# Patient Record
Sex: Female | Born: 1955 | Race: White | Hispanic: No | Marital: Married | Smoking: Never smoker
Health system: Southern US, Community
[De-identification: ages and names within clinical notes are randomized; demographics above are authoritative.]

## PROBLEM LIST (undated history)

## (undated) HISTORY — PX: KNEE SURGERY: SHX244

---

## 1998-06-30 ENCOUNTER — Ambulatory Visit (HOSPITAL_COMMUNITY): Admission: RE | Admit: 1998-06-30 | Discharge: 1998-06-30 | Payer: Self-pay

## 1999-09-12 ENCOUNTER — Ambulatory Visit (HOSPITAL_COMMUNITY): Admission: RE | Admit: 1999-09-12 | Discharge: 1999-09-12 | Payer: Self-pay | Admitting: Obstetrics & Gynecology

## 1999-09-12 ENCOUNTER — Encounter: Payer: Self-pay | Admitting: Obstetrics & Gynecology

## 1999-09-13 ENCOUNTER — Other Ambulatory Visit: Admission: RE | Admit: 1999-09-13 | Discharge: 1999-09-13 | Payer: Self-pay | Admitting: Obstetrics & Gynecology

## 2000-07-24 ENCOUNTER — Encounter: Payer: Self-pay | Admitting: Emergency Medicine

## 2000-07-24 ENCOUNTER — Emergency Department (HOSPITAL_COMMUNITY): Admission: EM | Admit: 2000-07-24 | Discharge: 2000-07-24 | Payer: Self-pay | Admitting: Emergency Medicine

## 2000-08-25 ENCOUNTER — Inpatient Hospital Stay (HOSPITAL_COMMUNITY): Admission: EM | Admit: 2000-08-25 | Discharge: 2000-08-28 | Payer: Self-pay | Admitting: Cardiology

## 2000-08-30 ENCOUNTER — Encounter: Payer: Self-pay | Admitting: Cardiology

## 2000-08-30 ENCOUNTER — Ambulatory Visit (HOSPITAL_COMMUNITY): Admission: RE | Admit: 2000-08-30 | Discharge: 2000-08-30 | Payer: Self-pay | Admitting: Cardiology

## 2000-10-16 ENCOUNTER — Other Ambulatory Visit: Admission: RE | Admit: 2000-10-16 | Discharge: 2000-10-16 | Payer: Self-pay | Admitting: Obstetrics & Gynecology

## 2000-10-18 ENCOUNTER — Encounter: Payer: Self-pay | Admitting: Internal Medicine

## 2000-10-18 ENCOUNTER — Ambulatory Visit (HOSPITAL_COMMUNITY): Admission: RE | Admit: 2000-10-18 | Discharge: 2000-10-18 | Payer: Self-pay | Admitting: Internal Medicine

## 2001-11-11 ENCOUNTER — Other Ambulatory Visit: Admission: RE | Admit: 2001-11-11 | Discharge: 2001-11-11 | Payer: Self-pay | Admitting: Obstetrics & Gynecology

## 2001-11-25 ENCOUNTER — Ambulatory Visit (HOSPITAL_COMMUNITY): Admission: RE | Admit: 2001-11-25 | Discharge: 2001-11-25 | Payer: Self-pay | Admitting: Obstetrics & Gynecology

## 2001-11-25 ENCOUNTER — Encounter: Payer: Self-pay | Admitting: Obstetrics & Gynecology

## 2002-12-17 ENCOUNTER — Ambulatory Visit (HOSPITAL_COMMUNITY): Admission: RE | Admit: 2002-12-17 | Discharge: 2002-12-17 | Payer: Self-pay | Admitting: Obstetrics & Gynecology

## 2002-12-17 ENCOUNTER — Encounter: Payer: Self-pay | Admitting: Obstetrics & Gynecology

## 2003-01-04 ENCOUNTER — Other Ambulatory Visit: Admission: RE | Admit: 2003-01-04 | Discharge: 2003-01-04 | Payer: Self-pay | Admitting: Obstetrics & Gynecology

## 2003-01-08 ENCOUNTER — Ambulatory Visit (HOSPITAL_COMMUNITY): Admission: RE | Admit: 2003-01-08 | Discharge: 2003-01-08 | Payer: Self-pay | Admitting: Obstetrics & Gynecology

## 2003-01-08 ENCOUNTER — Encounter: Payer: Self-pay | Admitting: Obstetrics & Gynecology

## 2004-02-04 ENCOUNTER — Ambulatory Visit (HOSPITAL_COMMUNITY): Admission: RE | Admit: 2004-02-04 | Discharge: 2004-02-04 | Payer: Self-pay | Admitting: Obstetrics & Gynecology

## 2005-02-28 ENCOUNTER — Ambulatory Visit (HOSPITAL_COMMUNITY): Admission: RE | Admit: 2005-02-28 | Discharge: 2005-02-28 | Payer: Self-pay | Admitting: Obstetrics & Gynecology

## 2005-03-01 ENCOUNTER — Encounter: Admission: RE | Admit: 2005-03-01 | Discharge: 2005-03-01 | Payer: Self-pay | Admitting: Orthopedic Surgery

## 2005-10-04 ENCOUNTER — Encounter: Admission: RE | Admit: 2005-10-04 | Discharge: 2005-10-04 | Payer: Self-pay | Admitting: Internal Medicine

## 2006-03-28 ENCOUNTER — Ambulatory Visit (HOSPITAL_COMMUNITY): Admission: RE | Admit: 2006-03-28 | Discharge: 2006-03-28 | Payer: Self-pay | Admitting: Obstetrics & Gynecology

## 2006-04-15 ENCOUNTER — Encounter: Admission: RE | Admit: 2006-04-15 | Discharge: 2006-04-15 | Payer: Self-pay | Admitting: Obstetrics & Gynecology

## 2007-07-15 ENCOUNTER — Ambulatory Visit (HOSPITAL_COMMUNITY): Admission: RE | Admit: 2007-07-15 | Discharge: 2007-07-15 | Payer: Self-pay | Admitting: Obstetrics & Gynecology

## 2008-07-01 ENCOUNTER — Encounter: Admission: RE | Admit: 2008-07-01 | Discharge: 2008-09-07 | Payer: Self-pay | Admitting: Neurology

## 2008-09-20 ENCOUNTER — Ambulatory Visit (HOSPITAL_COMMUNITY): Admission: RE | Admit: 2008-09-20 | Discharge: 2008-09-20 | Payer: Self-pay | Admitting: Obstetrics & Gynecology

## 2009-10-12 ENCOUNTER — Encounter: Admission: RE | Admit: 2009-10-12 | Discharge: 2009-10-12 | Payer: Self-pay | Admitting: Obstetrics & Gynecology

## 2010-10-28 ENCOUNTER — Encounter: Payer: Self-pay | Admitting: Internal Medicine

## 2010-10-29 ENCOUNTER — Encounter: Payer: Self-pay | Admitting: Obstetrics & Gynecology

## 2010-10-29 ENCOUNTER — Encounter: Payer: Self-pay | Admitting: Internal Medicine

## 2010-10-31 ENCOUNTER — Other Ambulatory Visit (HOSPITAL_COMMUNITY): Payer: Self-pay | Admitting: Obstetrics & Gynecology

## 2010-10-31 DIAGNOSIS — Z1239 Encounter for other screening for malignant neoplasm of breast: Secondary | ICD-10-CM

## 2010-11-13 ENCOUNTER — Ambulatory Visit (HOSPITAL_COMMUNITY)
Admission: RE | Admit: 2010-11-13 | Discharge: 2010-11-13 | Disposition: A | Discharge status: Home / Self Care | Payer: BC Managed Care – PPO | Source: Ambulatory Visit | Attending: Obstetrics & Gynecology | Admitting: Obstetrics & Gynecology

## 2010-11-13 DIAGNOSIS — Z1239 Encounter for other screening for malignant neoplasm of breast: Secondary | ICD-10-CM

## 2010-11-13 DIAGNOSIS — Z1231 Encounter for screening mammogram for malignant neoplasm of breast: Secondary | ICD-10-CM | POA: Insufficient documentation

## 2011-02-23 NOTE — Consult Note (Signed)
Nicole Hall. [facility]  Patient:    Nicole Hall, Nicole Hall                       MRN: [id number] Proc. Date: 08/27/00 Adm. Date:  [id number] Attending:  Talitha Hall Dictator:   Nicole Hall, N.P. CC:         Nicole Hall, M.D. [facility]  Nicole Hall, M.D.   Consultation Report  DATE OF BIRTH:  [birth year]  REASON FOR CONSULTATION:  Pericardial effusion, question occult malignancy.  REFERRING PHYSICIAN:  Noralyn Pick. Eden Hall, M.D. [facility]  HISTORY OF PRESENT ILLNESS:  [name] is a 55 year old female with no significant past medical history who presented to the emergency department at [facility] on July 24, 2000, with complaints of chest pain stating that she felt as if a brick was on her chest.  No diagnoses was established at that time.  Lab values during that visit to the emergency room found her hemoglobin to be 13.1, platelet count 272,000, WBCs 10.3, and neutrophils at 75% and albumin of 4.6.  Her chest discomfort persisted for the next few weeks.  On August 25, 2000, she presented to [facility] emergency department with increasing shortness of breath, dyspnea on exertion, fatigue and malaise.  She was found to have a pericardial effusion and was transferred to [facility] for further evaluation.  The etiology of this effusion is unknown but is suspected to be viral.  Her hemoglobin upon presentation to [facility] was 9.7, platelet count 518,000, WBCs 10, neutrophils 80% and albumin of 2.5.  We have been asked to see her for a possible occult cancer.  PAST MEDICAL HISTORY:  None.  CURRENT MEDICATIONS: 1. Aspirin 325 mg p.o. q. day. 2. Ferrous gluconate 300 mg p.o. t.i.d. 3. Ibuprofen 800 mg p.o. t.i.d.  The patient is on no medications at home.  ALLERGIES:  No known drug allergies.  FAMILY HISTORY:  The patient is adopted and is unaware of her biological parents medical history.  She does state, however, that she  believes a grandmother is deceased secondary to cancer.  SOCIAL HISTORY:  Nicole Hall lives in [place] with her husband and 3 children.  She has 3 sons ages 40, 80, and 40 who are all healthy.  She does not work outside of the home.  She reports occasional ETOH use and a very remote, "social" use of tobacco.  HEALTH MAINTENANCE: 1. Pap smear/pelvic.  Up to date. 2. Mammogram - December of 2000 (she has her next one scheduled for January of 2002. 3. Colonoscopy - None.  GYN HISTORY:  Gravida 4, para 3 with 1 spontaneous abortion.  Patient reports all of her deliveries were normal vaginal deliveries.  She denies any hormone replacement therapy but does report taking progesterone recently.  She describes herself as "perimenopausal".  She reports taking birth control pills approximately 25 years ago for a few months but was instructed to stop these secondary to a questionable superficial clot.  She reports menarche at age 2.  REVIEW OF SYSTEMS:  The patient denies any weight loss.  She has had some recent night sweats (three nights within the past week).  She denies any fevers.  She reports her appetite has been erratic for about the past week. Her energy level has been diminished.  She does report shortness of breath as well as some intermittent chest pressure.  She also reports the onset of a dry cough approximately  1-1/2 weeks ago.  She also has some left shoulder pain. She denies any hemoptysis.  She has had no edema.  She denies any recent change in her bowel habits.  She denies any breast pain, lumps, or nipple discharge.  She has had no recent change in her bowel habits.  She denies any rectal bleeding, melena, nausea, vomiting, or abdominal pain.  She denies any numbness or tingling of her extremities.  PHYSICAL EXAMINATION:  GENERAL:  Well-nourished pleasant Caucasian female in no acute distress.  HEENT:  Normocephalic and atraumatic; pupils, equal, round, reactive to  light; extraocular movements are intact; sclerae anicteric.  Oropharynx is clear.  LYMPH:  No palpable lymph nodes.  BREASTS:  No obvious masses or skin changes.  LUNGS:  With bibasilar bronchial breath sounds left greater than right.  CARDIOVASCULAR:  Pericardial rub.  ABDOMEN:  Soft, benign.  EXTREMITIES:  No edema.  RECTAL:  No masses; occult blood negative.  LABORATORY DATA:  August 26, 2000:  Hemoglobin 9.7, white count 10.0, platelets 518,000.  Iron 13, TIBC 195, percent saturations 7.  Ferritin 904. TSH 3.4.  Rheumatoid factor less than 20.  Sodium 140, potassium 4.1, BUN 10, creatinine 0.6, glucose 118.  Total bilirubin 0.6, alk. phos. 76, SGOT 14, SGPT 14, total protein 6.5, albumin 2.5, and calcium of 8.5.  CEA less than 0.5.  HIV nonreactive.  Blood cultures with no growth for 2 days.  CT scan of the abdomen and pelvis is pending.  IMPRESSION AND PLAN: 1. Acute pericarditis with probable bilateral pleural effusions - etiology    unknown. The etiological differential diagnosis of this is extensive and    includes a viral, autoimmune, bacterial (MTB), other (granulomatous, etc),    malignancy.     If the fluid does not resolve with conservative management suggest sampling    the fluid and possibly the pericardium itself.     We will review the CT scans of the abdomen and pelvis that the patient is    scheduled to have today, as well as the chest CT that the patient had done    at [facility].  2. Normochromic, normocytic anemia - probably anemia of chronic disease.  This    is an expected finding with any of the possible etiologies. 3. Thrombocytosis, reactive. 4. Hypoalbuminemia.  Nicole Hall discussed the above with Nicole Hall and her husband.  He will see the patient after the CT scan is done, and make further comment at that time.  The patient was seen and examined by Nicole Hall.  DD:  08/27/00 TD:  08/28/00 Job: [id number]

## 2011-02-23 NOTE — H&P (Signed)
[place]. [facility]  Patient:    Nicole Hall, Nicole Hall                       MRN: [id number] Adm. Date:  [id number] Attending:  Talitha Givens CC:         Dr. Nedra Hai, [facility]  Dr. Myrtis Ser  Dr. Andee Lineman   History and Physical  CHIEF COMPLAINT: Referral for evaluation of pericardial effusion.  HISTORY OF PRESENT ILLNESS: Nicole Hall is a 55 year old white female with no significant past medical history, who has been referred by Dr. Nedra Hai for evaluation of pericardial effusion.  The patient was earlier today seen at [facility] due to complaints of increased shortness of breath over the last four weeks.  The patients shortness of breath had progressed to the point where it was very difficult for her to do any type of daily activities. In addition, she reports pleuritic type chest pain which has gradually worsened over the last few weeks.  There is no radiation of the pain into the neck, arm, or jaw.  She reports orthopnea but no PND.  She denies syncope or palpitations.  She was seen earlier today in the [facility] Room and worked up for possible pulmonary embolism.  A spiral CT scan was done and this was negative for pulmonary embolism but she was found to have a large pericardial effusion as well as bilateral pleural effusion and infiltrate/ atelectasis of the left lower lobe.  The patient reports a viral illness over the last two weeks.  She has taken frequent Tylenol and Advil.  She was also prescribed a Z-pak for five days due to nasal congestion and cough.  However, she denies viral illness prior to the onset of symptoms of shortness of breath.  She reports no syncope or presyncope.  PAST MEDICAL HISTORY: The patient gets yearly pelvic and breast examinations, which have been within normal limits.  There is no history of tuberculosis and she had a negative skin test two years ago.  There is heart murmur.  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  She does not smoke or drink alcohol.  FAMILY HISTORY: Noncontributory for coronary artery disease or other concern for heart disease.  ALLERGIES: No known drug allergies.  MEDICATIONS: NSAIDs including Advil and ibuprofen p.r.n.  REVIEW OF SYSTEMS: The patient reports last week she had fevers although she has not measured her temperature.  She also reports chills for two weeks.  She has had no nausea, vomiting, or diarrhea.  No hematochezia, hemoptysis, or hematemesis.  She denies weight loss and she has maintained her appetite.  She denies skin rashes including a malar rash.  She denies any foreign of out-of-state travel.  PHYSICAL EXAMINATION:  VITAL SIGNS: Blood pressure is 140/70.  Pulsus paradoxus was measured at 10 mmHg.  Heart rate 103-110 beats per minute.  The patient was afebrile on admission.  GENERAL: The patient is a well-nourished white female, very anxious and tearful but in no apparent distress.  HEENT: Pupils isocoric.  Conjunctivae clear.  NECK: JVD 12 cm with positive jugular reflux, no obvious steep X or Y descent on inspection of the jugular venous pulse.  No Kussmaul sign.  Normal carotid upstrokes with no carotid bruits.  No thyromegaly.  No nodular thyroid.  NODES: No cervical, occipital, supraclavicular, or axillary nodes were felt on palpation.  BREAST:  No obvious masses or nodules on bilateral breast examination.  BACK: No CVA tenderness.  No rash.  LUNGS: Decreased breath sounds bilaterally, approximately one-third of the way up; positive egophony; no crackles.  HEART: Tachycardic.  Normal S1 and S2.  Normal physiologic splitting of the second heart sound.  A three component pericardial left friction rub is audible at the left lower and left upper sternal border.  There is no S3. There are no obvious murmurs.  PMI is non-displaced but is  difficult to palpate.  ABDOMEN: Soft, nontender.  Good bowel sounds.  No hepatomegaly.  No pulsatile liver.  No splenomegaly.  GU: Examination deferred.   Please refer to above.  The patient gets a yearly pelvic examination.  EXTREMITIES: There are 2+ peripheral pulses noted.  There is no clubbing, cyanosis, or edema.  SKIN: No rashes.  NEUROLOGIC: The patient is alert and oriented, grossly nonfocal.  LABORATORY DATA: A 12 lead electrocardiogram showed normal sinus rhythm with low voltage QRS in extremity leads only; no infarct patterns; no electrical alternans seen on EKG.  Chest x-ray shows cardiomegaly with bilateral pleural effusions and streaky atelectasis in both lower lobes.  CT scan (this was reviewed with the radiologist, this study done at [facility]) showed large pericardial effusion which is circumferential; bilateral pleural effusions which are moderate in size and are layering; compression atelectasis in the left lower lobe; no obvious infiltrates otherwise; no evidence of dissection; no evidence of pulmonary embolism on this study; no perihilar or paratracheal nodes seen.  A 2D echocardiogram shows a large pericardial effusion predominantly posterior, where it is approximately 2 cm during diastole; approximately 0.5 cm anteriorly; no obvious RV collapse during early diastole; RA inversion noted; also by M mode there is no evidence of RV collapse; mitral inflow and tricuspid inflow were not suggestive of tamponade physiology - in particular there were no - changes; normal LV size and contraction; normal left and right atrium.  Of note is that pericardial effusion would only be accessible with a pericardiocentesis needle from the anterior axillary line.  An approach from the apex or parasternal or  subxiphoid sites would be very difficult as there is less than a 0.5 cm effusion in these locations.  IMPRESSION/PLAN: Pericardial effusion, E causa ignota.  Most  likely causes include viral pericarditis or idiopathic pericarditis, particularly because there is evidence of inflammation on physical examination with a three component friction rub.  However, tumor/malignancy cannot be excluded.  There is no obvious evidence on physical examination of a primary malignancy. Differential diagnosis also includes collagen vascular diseases and particularly systemic lupus erythematosus, although there are no other clinical stigmata.  The patient also will be ruled out for hypothyroidism and a TSH level is pending.  Other infectious causes are unlikely, in particular purulent pericarditis or tuberculous pericarditis seems unlikely at this point in time.  There is no obvious tamponade physiology by 2D echocardiogram criteria.  However, there is a 10 mmHg pulsus paradoxus by physical examination which could be suggestive of pre-tamponade physiology.  There is no absolute indication presently to proceed with diagnostic/therapeutic pericardiocentesis, particularly because of the unfavorable location of the pericardial effusion.  If there is no obvious diagnosis established by serologic testing and/or pulsus paradoxus worsens and/or there is evidence of tamponade physiology by cardiac catheterization then the patient should proceed with subxiphoid placement of a pericardial drain or a video-assisted thoracoscopic procedure.   The patient in the interim will be started on nonsteroidal therapy with Indomethacin 50  mg p.o. t.i.d.  The risks and benefits of a cardiac catheterization were carefully discussed with the patient.  Due to equivocal findings on physical examination of possible tamponade physiology I feel a right and left heart catheterization without coronary angiography is indicated at the present time.  If tamponade physiology is established then pericardial drainage as well as obtaining a pericardial tissue specimen is indicated.  The risks and benefits  of pericardiocentesis versus surgical approach to the pericardial effusion has been discussed extensively with the patient and her husband.  In the interim we will obtain thyroid function tests, human immunodeficiency virus testing, complement levels, ANA, rheumatoid factor, and sedimentation rate.  Blood cultures x 2 will be obtained and PPD skin test with Candida control will be applied. DD:  08/26/00 TD:  08/26/00 Job: [id number]

## 2011-02-23 NOTE — Cardiovascular Report (Signed)
[place]. [facility]  Patient:    Nicole Hall, Nicole Hall                       MRN: [id number] Proc. Date: 08/26/00 Adm. Date:  [id number] Attending:  Talitha Givens Dictator:   Noralyn Pick Eden Emms, M.D. [facility]                        Cardiac Catheterization  PROCEDURE:  Coronary Angiography.  CARDIOLOGIST:  Noralyn Pick. Eden Emms, M.D. [facility]  INDICATION:  Large pericardial effusion. Rule out tamponade.  RESULTS:  Left main coronary artery was normal.  Left anterior descending artery was normal.  Circumflex coronary artery was normal.  Right coronary artery was dominant and was normal.  RAO VENTRICULOGRAPHY:  RAO ventriculography revealed hyperdynamic LV function.  The ejection fraction was in excess of 80%.  There was catheter-induced and post PVC MR.  RIGHT HEART CATHETERIZATION:  Right heart catheterization showed a mean right atrial pressure of 15 mmHg.  A PA pressure was 37/22 with a mean of 29.  RV pressure was 37/19.  Mean pulmonary capillary wedge pressure was 21 mmHg.  LV pressure was 139/19.  Aortic pressure was 139/75.  There was a 10 mm pulsus with inspiration.  Cardiac output was 5.9 mm per minute.  Simultaneous RV and LV catheter tracing showed concordance and no evidence of effusive constrictive or constrictive physiology.  IMPRESSION:  [name] symptoms appear to be improving.  She has no evidence of equalization of pressures.  Her left-sided pressures seem to be 5 to 6 mmHg higher than her right-sided pressures.  Her cardiac output is currently normal.  I think initial conservative approach with nonsteroidal therapy and watchful waiting is in order.  Most of her effusion is posteriorly and to drain would require surgical window.  I suspect she has viral syndrome, and hopefully this will resolve without needing surgical intervention. DD:  08/26/00 TD:  08/26/00 Job: [id number]

## 2011-02-23 NOTE — Discharge Summary (Signed)
Nicole Hall. [facility]  Patient:    Nicole Hall, Nicole Hall                       MRN: [id number] Adm. Date:  [id number] Disc. Date: [id number] Attending:  Talitha Givens Dictator:   Joellyn Rued, P.A.C. CC:         Hillery Jacks, M.D., [facility]   Discharge Summary  DATE OF BIRTH:  06-13-2056  SUMMARY OF HISTORY:  Nicole Hall is a 55 year old white female transferred from [facility] by Hillery Jacks, M.D., for evaluation of pericardial effusion.  She was seen earlier on the day of admission at [facility] secondary to complaints of increased shortness of breath over the preceding four weeks.  She also describes a pleuritic-type chest discomfort and orthopnea, but no PND.  She denies syncope or palpitations.  A spiral CT was performed, negative for pulmonary embolism, but was found to have a large pericardial effusion, as well as bilateral pleural effusions and infiltrates in the left lower lobe. The patient does report a viral illness over the preceding two weeks.  She has taken frequent Tylenol and Advil and prescribed a Z-Pak for five days due to nasal congestion and cough.  However, she denies viral illness prior to the onset of symptoms of shortness of breath.  Her medical history is notable for a heart murmur.  She gets yearly pelvic and breast examinations, which have been within normal limits.  There is no history of tuberculosis with a negative skin test two years ago.  She does not smoke or drink.  LABORATORY DATA:  ANA was negative.  Rheumatoid factor was less than 20. Blood cultures did not show any growth after two days.  Compliment C3 was 157, compliment C4 32, and compliment total pending at the time of this dictation. The CEA was less than 0.5.  HIV was nonreactive.  Iron was 13, TIBC 195, percent saturation 7, and ferritin 904.  The TSH was 3.4 and free T4 1.22. CKs and troponins were negative for myocardial infarction.  The  admission sodium was 140, potassium 4.1, BUN 10, creatinine 0.6, glucose 118, lipase 21, and amylase 27.  LFTs were within normal limits.  The hemoglobin was 9.7, hematocrit 28.7, normal indices, platelets 518, and WBC 10.0.  ESR was elevated at 128.  The EKG showed sinus tachycardia.  HOSPITAL COURSE:  Nicole Hall was admitted to the unit 2000 by Lewayne Bunting, M.D. An echocardiogram was performed by Dr. Andee Lineman.  Cardiac catheterization on August 26, 2000, showed normal coronaries and an ejection fraction of 80%. PA pressures were 37/22 with a mean of 29, RV 37/19, wedge 20-21, LV 139/19, aortic 139/75, cardiac output 5.9 L/min, and RV/LV concordant.  Dr. Noralyn Pick. Nishans impression was felt that she should be placed on nonsteroidals.  He did not feel that pericardial window was needed at this time.  A PPD was performed on August 27, 2000, to the right forearm.  It was also noted that she was anemic with an albumin of only 2.5.  Initial consideration for colonoscopy was mentioned.  An abdominal CT was performed, but is pending at the time of this dictation.  Hematology/oncology saw her in consultation on August 27, 2000.  It was felt that her anemia was due to chronic etiology. On August 28, 2000, it was felt that the patient could be discharged home.  DISCHARGE DIAGNOSES: 1. Pericardial and pleural effusions, probable viral etiology. 2.  Iron deficiency anemia.  DISPOSITION:  She is discharged home.  DISCHARGE MEDICATIONS: 1. Motrin 800 mg t.i.d. 2. Coated aspirin 325 mg q.d.  ACTIVITY:  She is advised no driving alone and to limit her stairs and exertion.  DIET:  She was asked to maintain a low-salt, low-fat, and low-cholesterol diet.  FOLLOW-UP:  If she has any problems with her catheterization site, she is instructed to call.  On Friday, she is to report to admitting at 8 a.m. for chest x-ray followed by echocardiogram.  Lewayne Bunting, M.D., will be paged during the  echocardiogram and be evaluated by her.  A Holter monitor will also be performed at [facility] at 11 a.m.  She will see Dr. Andee Lineman on Tuesday, September 03, 2000, at 9 a.m. DD:  08/28/00 TD:  08/28/00 Job: [id number]

## 2012-10-29 ENCOUNTER — Other Ambulatory Visit: Payer: Self-pay | Admitting: Obstetrics & Gynecology

## 2012-10-29 DIAGNOSIS — Z1231 Encounter for screening mammogram for malignant neoplasm of breast: Secondary | ICD-10-CM

## 2012-11-27 ENCOUNTER — Ambulatory Visit: Payer: BC Managed Care – PPO

## 2012-12-17 ENCOUNTER — Ambulatory Visit
Admission: RE | Admit: 2012-12-17 | Discharge: 2012-12-17 | Disposition: A | Discharge status: Home / Self Care | Payer: BC Managed Care – PPO | Source: Ambulatory Visit | Attending: Obstetrics & Gynecology | Admitting: Obstetrics & Gynecology

## 2012-12-17 ENCOUNTER — Ambulatory Visit: Payer: BC Managed Care – PPO

## 2012-12-17 DIAGNOSIS — Z1231 Encounter for screening mammogram for malignant neoplasm of breast: Secondary | ICD-10-CM

## 2013-12-01 ENCOUNTER — Other Ambulatory Visit: Payer: Self-pay

## 2013-12-01 ENCOUNTER — Other Ambulatory Visit: Payer: Self-pay | Admitting: Obstetrics & Gynecology

## 2013-12-01 DIAGNOSIS — Z1231 Encounter for screening mammogram for malignant neoplasm of breast: Secondary | ICD-10-CM

## 2013-12-24 ENCOUNTER — Ambulatory Visit
Admission: RE | Admit: 2013-12-24 | Discharge: 2013-12-24 | Disposition: A | Discharge status: Home / Self Care | Payer: 59 | Source: Ambulatory Visit | Attending: Obstetrics & Gynecology | Admitting: Obstetrics & Gynecology

## 2013-12-24 DIAGNOSIS — Z1231 Encounter for screening mammogram for malignant neoplasm of breast: Secondary | ICD-10-CM

## 2017-03-08 DIAGNOSIS — J209 Acute bronchitis, unspecified: Secondary | ICD-10-CM | POA: Diagnosis not present

## 2017-04-24 DIAGNOSIS — Z1231 Encounter for screening mammogram for malignant neoplasm of breast: Secondary | ICD-10-CM | POA: Diagnosis not present

## 2017-05-15 ENCOUNTER — Ambulatory Visit (INDEPENDENT_AMBULATORY_CARE_PROVIDER_SITE_OTHER): Payer: 59 | Admitting: Obstetrics & Gynecology

## 2017-05-15 ENCOUNTER — Encounter: Payer: Self-pay | Admitting: Obstetrics & Gynecology

## 2017-05-15 VITALS — BP 124/78 | Ht 64.5 in | Wt 155.0 lb

## 2017-05-15 DIAGNOSIS — Z01419 Encounter for gynecological examination (general) (routine) without abnormal findings: Secondary | ICD-10-CM

## 2017-05-15 DIAGNOSIS — Z1151 Encounter for screening for human papillomavirus (HPV): Secondary | ICD-10-CM

## 2017-05-15 DIAGNOSIS — Z780 Asymptomatic menopausal state: Secondary | ICD-10-CM | POA: Diagnosis not present

## 2017-05-15 NOTE — Progress Notes (Signed)
Nicole Hall March 21, 1956 [id number]   History:    61 y.o. Married.  3 sons.  Oldest has a 28 yo daughter.  RP:  Established patient presenting for Annual/Gyn exam  HPI:  Menopause.  No HRT.  No PMB.  No abdominopelvic pain.  Breasts wnl.  Mictions/BMs wnl.  Past medical history,surgical history, family history and social history were all reviewed and documented in the EPIC chart.  Gynecologic History No LMP recorded. Patient is postmenopausal. Contraception: post menopausal status Last Pap: 2016. Results were: normal Last mammogram: 2018. Results were: normal  Obstetric History OB History  Gravida Para Term Preterm AB Living  _0 SAB TAB Ectopic Multiple Live Births               # Outcome Date GA Lbr Len/2nd Weight Sex Delivery Anes PTL Lv  4 Gravida           3 Para           2 Para           1 Para                ROS: A ROS was performed and pertinent positives and negatives are included in the history.  GENERAL: No fevers or chills. HEENT: No change in vision, no earache, sore throat or sinus congestion. NECK: No pain or stiffness. CARDIOVASCULAR: No chest pain or pressure. No palpitations. PULMONARY: No shortness of breath, cough or wheeze. GASTROINTESTINAL: No abdominal pain, nausea, vomiting or diarrhea, melena or bright red blood per rectum. GENITOURINARY: No urinary frequency, urgency, hesitancy or dysuria. MUSCULOSKELETAL: No joint or muscle pain, no back pain, no recent trauma. DERMATOLOGIC: No rash, no itching, no lesions. ENDOCRINE: No polyuria, polydipsia, no heat or cold intolerance. No recent change in weight. HEMATOLOGICAL: No anemia or easy bruising or bleeding. NEUROLOGIC: No headache, seizures, numbness, tingling or weakness. PSYCHIATRIC: No depression, no loss of interest in normal activity or change in sleep pattern.     Exam:   BP 124/78   Ht 5' 4.5" (1.638 m)   Wt 155 lb (70.3 kg)   BMI 26.19 kg/m   Body mass index is 26.19  kg/m.  General appearance : Well developed well nourished female. No acute distress HEENT: Eyes: no retinal hemorrhage or exudates,  Neck supple, trachea midline, no carotid bruits, no thyroidmegaly Lungs: Clear to auscultation, no rhonchi or wheezes, or rib retractions  Heart: Regular rate and   rhythm, no murmurs or gallops Breast:Examined in sitting and supine position were symmetrical in appearance, no palpable masses or tenderness,  no skin retraction, no nipple inversion, no nipple discharge, no skin discoloration, no axillary or supraclavicular lymphadenopathy Abdomen: no palpable masses or tenderness, no rebound or guarding Extremities: no edema or skin discoloration or tenderness  Pelvic: Vulva normal  Bartholin, Urethra, Skene Glands: Within normal limits             Vagina: No gross lesions or discharge  Cervix: No gross lesions or discharge.  Pap/HPV HR done.  Uterus  AV, normal size, shape and consistency, non-tender and mobile  Adnexa  Without masses or tenderness  Anus and perineum  normal    Assessment/Plan:  61 y.o. female for annual exam   1. Encounter for routine gynecological examination with Papanicolaou smear of cervix Normal Gyn exam.  Pap/HPV done.  Breasts wnl.  Mammo normal 2018. - CBC; Future - Comp Met (CMET); Future -  TSH; Future - Vitamin D 1,25 dihydroxy; Future - Lipid panel; Future - PAP,TP IMGw/HPV RNA,rflx HPVTYPE16,18/45  2. Menopause present No HRT.  No PMB.  Vit D supplements, Ca++ in nutrition.    3. Special screening examination for human papillomavirus (HPV)  - PAP,TP IMGw/HPV RNA,rflx HPVTYPE16,18/45  [name] MD, 2:15 PM 05/15/2017   

## 2017-05-19 NOTE — Patient Instructions (Signed)
1. Encounter for routine gynecological examination with Papanicolaou smear of cervix Normal Gyn exam.  Pap/HPV done.  Breasts wnl.  Mammo normal 2018. - CBC; Future - Comp Met (CMET); Future - TSH; Future - Vitamin D 1,25 dihydroxy; Future - Lipid panel; Future - PAP,TP IMGw/HPV RNA,rflx HPVTYPE16,18/45  2. Menopause present No HRT.  No PMB.  Vit D supplements, Ca++ in nutrition.    3. Special screening examination for human papillomavirus (HPV)  - PAP,TP IMGw/HPV RNA,rflx [id number],24/26  Nicole Hall, it was a pleasure to see you today!  I will inform you of your results as soon as available.

## 2017-05-20 LAB — PAP, TP IMAGING W/ HPV RNA, RFLX HPV TYPE 16,18/45: HPV mRNA, High Risk: NOT DETECTED

## 2017-07-26 DIAGNOSIS — Z23 Encounter for immunization: Secondary | ICD-10-CM | POA: Diagnosis not present

## 2017-08-23 DIAGNOSIS — J209 Acute bronchitis, unspecified: Secondary | ICD-10-CM | POA: Diagnosis not present

## 2017-09-21 DIAGNOSIS — Z0000 Encounter for general adult medical examination without abnormal findings: Secondary | ICD-10-CM | POA: Diagnosis not present

## 2017-11-14 DIAGNOSIS — H2513 Age-related nuclear cataract, bilateral: Secondary | ICD-10-CM | POA: Diagnosis not present

## 2018-06-17 ENCOUNTER — Other Ambulatory Visit: Payer: Self-pay | Admitting: Obstetrics & Gynecology

## 2018-06-17 DIAGNOSIS — Z1231 Encounter for screening mammogram for malignant neoplasm of breast: Secondary | ICD-10-CM

## 2018-07-01 DIAGNOSIS — M9903 Segmental and somatic dysfunction of lumbar region: Secondary | ICD-10-CM | POA: Diagnosis not present

## 2018-07-01 DIAGNOSIS — M9904 Segmental and somatic dysfunction of sacral region: Secondary | ICD-10-CM | POA: Diagnosis not present

## 2018-07-01 DIAGNOSIS — M5431 Sciatica, right side: Secondary | ICD-10-CM | POA: Diagnosis not present

## 2018-07-03 DIAGNOSIS — M5431 Sciatica, right side: Secondary | ICD-10-CM | POA: Diagnosis not present

## 2018-07-03 DIAGNOSIS — M9904 Segmental and somatic dysfunction of sacral region: Secondary | ICD-10-CM | POA: Diagnosis not present

## 2018-07-03 DIAGNOSIS — M9903 Segmental and somatic dysfunction of lumbar region: Secondary | ICD-10-CM | POA: Diagnosis not present

## 2018-07-07 DIAGNOSIS — M9903 Segmental and somatic dysfunction of lumbar region: Secondary | ICD-10-CM | POA: Diagnosis not present

## 2018-07-07 DIAGNOSIS — M9904 Segmental and somatic dysfunction of sacral region: Secondary | ICD-10-CM | POA: Diagnosis not present

## 2018-07-07 DIAGNOSIS — M5431 Sciatica, right side: Secondary | ICD-10-CM | POA: Diagnosis not present

## 2018-07-10 DIAGNOSIS — M9904 Segmental and somatic dysfunction of sacral region: Secondary | ICD-10-CM | POA: Diagnosis not present

## 2018-07-10 DIAGNOSIS — M9903 Segmental and somatic dysfunction of lumbar region: Secondary | ICD-10-CM | POA: Diagnosis not present

## 2018-07-10 DIAGNOSIS — M5431 Sciatica, right side: Secondary | ICD-10-CM | POA: Diagnosis not present

## 2018-07-18 ENCOUNTER — Ambulatory Visit
Admission: RE | Admit: 2018-07-18 | Discharge: 2018-07-18 | Disposition: A | Discharge status: Home / Self Care | Payer: 59 | Source: Ambulatory Visit | Attending: Obstetrics & Gynecology | Admitting: Obstetrics & Gynecology

## 2018-07-18 DIAGNOSIS — Z1231 Encounter for screening mammogram for malignant neoplasm of breast: Secondary | ICD-10-CM

## 2018-07-23 DIAGNOSIS — M9904 Segmental and somatic dysfunction of sacral region: Secondary | ICD-10-CM | POA: Diagnosis not present

## 2018-07-23 DIAGNOSIS — M9903 Segmental and somatic dysfunction of lumbar region: Secondary | ICD-10-CM | POA: Diagnosis not present

## 2018-07-23 DIAGNOSIS — M5431 Sciatica, right side: Secondary | ICD-10-CM | POA: Diagnosis not present

## 2018-07-25 DIAGNOSIS — Z23 Encounter for immunization: Secondary | ICD-10-CM | POA: Diagnosis not present

## 2018-10-30 ENCOUNTER — Ambulatory Visit (INDEPENDENT_AMBULATORY_CARE_PROVIDER_SITE_OTHER): Payer: 59 | Admitting: Obstetrics & Gynecology

## 2018-10-30 ENCOUNTER — Encounter: Payer: Self-pay | Admitting: Obstetrics & Gynecology

## 2018-10-30 VITALS — BP 128/76 | Ht 64.5 in | Wt 158.0 lb

## 2018-10-30 DIAGNOSIS — Z780 Asymptomatic menopausal state: Secondary | ICD-10-CM | POA: Diagnosis not present

## 2018-10-30 DIAGNOSIS — Z01419 Encounter for gynecological examination (general) (routine) without abnormal findings: Secondary | ICD-10-CM | POA: Diagnosis not present

## 2018-10-30 DIAGNOSIS — Z13820 Encounter for screening for osteoporosis: Secondary | ICD-10-CM

## 2018-10-30 NOTE — Progress Notes (Signed)
Manvir Thorson Rountree 1956/06/21 [id number]   History:    63 y.o. [id number] Married.  3 Sons, GM of 2.  Adopted, found her biologic family through 66 and me!  :-)  RP:  Established patient presenting for annual gyn exam   HPI: Menopause, well on no hormone replacement therapy.  No postmenopausal bleeding.  No pelvic pain.  No pain with intercourse.  Normal vaginal secretions.  Breasts normal.  Urine and bowel movements normal.  Body mass index 26.7.  Very active, exercising regularly.  Healthy nutrition.  Health labs with family physician.  Past medical history,surgical history, family history and social history were all reviewed and documented in the EPIC chart.  Gynecologic History No LMP recorded. Patient is postmenopausal. Contraception: post menopausal status Last Pap: 05/2017. Results were: Negative, HPV HR neg Last mammogram: 07/2018. Results were: Negative Bone Density: Never, will schedule here now. Colonoscopy: 2011  Obstetric History OB History  Gravida Para Term Preterm AB Living  4 3       3   SAB TAB Ectopic Multiple Live Births               # Outcome Date GA Lbr Len/2nd Weight Sex Delivery Anes PTL Lv  4 Gravida           3 Para           2 Para           1 Para              ROS: A ROS was performed and pertinent positives and negatives are included in the history.  GENERAL: No fevers or chills. HEENT: No change in vision, no earache, sore throat or sinus congestion. NECK: No pain or stiffness. CARDIOVASCULAR: No chest pain or pressure. No palpitations. PULMONARY: No shortness of breath, cough or wheeze. GASTROINTESTINAL: No abdominal pain, nausea, vomiting or diarrhea, melena or bright red blood per rectum. GENITOURINARY: No urinary frequency, urgency, hesitancy or dysuria. MUSCULOSKELETAL: No joint or muscle pain, no back pain, no recent trauma. DERMATOLOGIC: No rash, no itching, no lesions. ENDOCRINE: No polyuria, polydipsia, no heat or cold intolerance. No recent  change in weight. HEMATOLOGICAL: No anemia or easy bruising or bleeding. NEUROLOGIC: No headache, seizures, numbness, tingling or weakness. PSYCHIATRIC: No depression, no loss of interest in normal activity or change in sleep pattern.     Exam:   BP 128/76   Ht 5' 4.5" (1.638 m)   Wt 158 lb (71.7 kg)   BMI 26.70 kg/m   Body mass index is 26.7 kg/m.  General appearance : Well developed well nourished female. No acute distress HEENT: Eyes: no retinal hemorrhage or exudates,  Neck supple, trachea midline, no carotid bruits, no thyroidmegaly Lungs: Clear to auscultation, no rhonchi or wheezes, or rib retractions  Heart: Regular rate and rhythm, no murmurs or gallops Breast:Examined in sitting and supine position were symmetrical in appearance, no palpable masses or tenderness,  no skin retraction, no nipple inversion, no nipple discharge, no skin discoloration, no axillary or supraclavicular lymphadenopathy Abdomen: no palpable masses or tenderness, no rebound or guarding Extremities: no edema or skin discoloration or tenderness  Pelvic: Vulva: Normal             Vagina: No gross lesions or discharge  Cervix: No gross lesions or discharge  Uterus  AV, normal size, shape and consistency, non-tender and mobile  Adnexa  Without masses or tenderness  Anus: Normal   Assessment/Plan:  63 y.o. female  for annual exam   1. Well female exam with routine gynecological exam Normal gynecologic exam and menopause.  Pap test was negative with negative high-risk HPV in August 2018.  No indication to repeat a Pap test this year.  Breast exam normal.  Screening mammogram in October 2019 was negative.  Health labs with family physician.  We will repeat a colonoscopy in 2021.  Body mass index at 26.7.  Recommend slightly decreasing calories/carbs for weight loss and continuing with aerobic activities 5 times a week, and adding weightlifting every 2 days.  2. Postmenopausal Well on no hormone replacement  therapy.  No postmenopausal bleeding.  3. Screening for osteoporosis Will schedule bone density here now.  Recommend vitamin D supplements, calcium intake of 1.2 to 1.5 g/day including nutritional and supplemental, regular weight bearing physical activities. - DG Bone Density; Future  Princess Bruins MD, 2:21 PM 10/30/2018

## 2018-10-30 NOTE — Patient Instructions (Signed)
1. Well female exam with routine gynecological exam Normal gynecologic exam and menopause.  Pap test was negative with negative high-risk HPV in August 2018.  No indication to repeat a Pap test this year.  Breast exam normal.  Screening mammogram in October 2019 was negative.  Health labs with family physician.  We will repeat a colonoscopy in 2021.  Body mass index at 26.7.  Recommend slightly decreasing calories/carbs for weight loss and continuing with aerobic activities 5 times a week, and adding weightlifting every 2 days.  2. Postmenopausal Well on no hormone replacement therapy.  No postmenopausal bleeding.  3. Screening for osteoporosis Will schedule bone density here now.  Recommend vitamin D supplements, calcium intake of 1.2 to 1.5 g/day including nutritional and supplemental, regular weight bearing physical activities. - DG Bone Density; Future  Nicole Hall, it was a pleasure seeing you today!  I will inform you of your bone density results as soon as they are available.

## 2018-11-05 DIAGNOSIS — L821 Other seborrheic keratosis: Secondary | ICD-10-CM | POA: Diagnosis not present

## 2018-11-05 DIAGNOSIS — D229 Melanocytic nevi, unspecified: Secondary | ICD-10-CM | POA: Diagnosis not present

## 2018-11-07 DIAGNOSIS — Z6826 Body mass index (BMI) 26.0-26.9, adult: Secondary | ICD-10-CM | POA: Diagnosis not present

## 2018-11-07 DIAGNOSIS — E663 Overweight: Secondary | ICD-10-CM | POA: Diagnosis not present

## 2018-11-07 DIAGNOSIS — Z0000 Encounter for general adult medical examination without abnormal findings: Secondary | ICD-10-CM | POA: Diagnosis not present

## 2018-12-02 DIAGNOSIS — M9905 Segmental and somatic dysfunction of pelvic region: Secondary | ICD-10-CM | POA: Diagnosis not present

## 2018-12-02 DIAGNOSIS — M9904 Segmental and somatic dysfunction of sacral region: Secondary | ICD-10-CM | POA: Diagnosis not present

## 2018-12-02 DIAGNOSIS — M9903 Segmental and somatic dysfunction of lumbar region: Secondary | ICD-10-CM | POA: Diagnosis not present

## 2018-12-04 DIAGNOSIS — M9903 Segmental and somatic dysfunction of lumbar region: Secondary | ICD-10-CM | POA: Diagnosis not present

## 2018-12-04 DIAGNOSIS — M9905 Segmental and somatic dysfunction of pelvic region: Secondary | ICD-10-CM | POA: Diagnosis not present

## 2018-12-04 DIAGNOSIS — M9904 Segmental and somatic dysfunction of sacral region: Secondary | ICD-10-CM | POA: Diagnosis not present

## 2018-12-08 DIAGNOSIS — M9904 Segmental and somatic dysfunction of sacral region: Secondary | ICD-10-CM | POA: Diagnosis not present

## 2018-12-08 DIAGNOSIS — M9903 Segmental and somatic dysfunction of lumbar region: Secondary | ICD-10-CM | POA: Diagnosis not present

## 2018-12-08 DIAGNOSIS — M9905 Segmental and somatic dysfunction of pelvic region: Secondary | ICD-10-CM | POA: Diagnosis not present

## 2018-12-11 DIAGNOSIS — H2513 Age-related nuclear cataract, bilateral: Secondary | ICD-10-CM | POA: Diagnosis not present

## 2018-12-12 DIAGNOSIS — M9905 Segmental and somatic dysfunction of pelvic region: Secondary | ICD-10-CM | POA: Diagnosis not present

## 2018-12-12 DIAGNOSIS — M9904 Segmental and somatic dysfunction of sacral region: Secondary | ICD-10-CM | POA: Diagnosis not present

## 2018-12-12 DIAGNOSIS — M9903 Segmental and somatic dysfunction of lumbar region: Secondary | ICD-10-CM | POA: Diagnosis not present

## 2019-04-02 DIAGNOSIS — M79672 Pain in left foot: Secondary | ICD-10-CM | POA: Diagnosis not present

## 2019-04-07 DIAGNOSIS — M79672 Pain in left foot: Secondary | ICD-10-CM | POA: Diagnosis not present

## 2019-04-16 DIAGNOSIS — M79672 Pain in left foot: Secondary | ICD-10-CM | POA: Diagnosis not present

## 2019-04-21 DIAGNOSIS — M79672 Pain in left foot: Secondary | ICD-10-CM | POA: Diagnosis not present

## 2019-04-27 DIAGNOSIS — M79672 Pain in left foot: Secondary | ICD-10-CM | POA: Diagnosis not present

## 2019-05-11 ENCOUNTER — Other Ambulatory Visit: Payer: Self-pay

## 2019-05-28 DIAGNOSIS — M79672 Pain in left foot: Secondary | ICD-10-CM | POA: Diagnosis not present

## 2019-05-28 DIAGNOSIS — M722 Plantar fascial fibromatosis: Secondary | ICD-10-CM | POA: Diagnosis not present

## 2019-06-22 ENCOUNTER — Other Ambulatory Visit: Payer: Self-pay | Admitting: Obstetrics & Gynecology

## 2019-06-22 DIAGNOSIS — Z1231 Encounter for screening mammogram for malignant neoplasm of breast: Secondary | ICD-10-CM

## 2019-07-13 ENCOUNTER — Other Ambulatory Visit: Payer: Self-pay

## 2019-07-13 DIAGNOSIS — Z20822 Contact with and (suspected) exposure to covid-19: Secondary | ICD-10-CM

## 2019-07-15 LAB — NOVEL CORONAVIRUS, NAA: SARS-CoV-2, NAA: NOT DETECTED

## 2019-08-10 ENCOUNTER — Ambulatory Visit: Payer: 59

## 2019-09-29 ENCOUNTER — Other Ambulatory Visit: Payer: Self-pay

## 2019-09-29 ENCOUNTER — Ambulatory Visit
Admission: RE | Admit: 2019-09-29 | Discharge: 2019-09-29 | Disposition: A | Discharge status: Home / Self Care | Payer: PPO | Source: Ambulatory Visit | Attending: Obstetrics & Gynecology | Admitting: Obstetrics & Gynecology

## 2019-09-29 DIAGNOSIS — Z1231 Encounter for screening mammogram for malignant neoplasm of breast: Secondary | ICD-10-CM | POA: Diagnosis not present

## 2019-10-12 IMAGING — MG DIGITAL SCREENING BILATERAL MAMMOGRAM WITH TOMO AND CAD
8 series · 8 of 24 positions shown · non-contrast
Comparison: Previous exam(s).

CLINICAL DATA: Screening.

EXAM:
DIGITAL SCREENING BILATERAL MAMMOGRAM WITH TOMO AND CAD

[L MLO synth-2D]
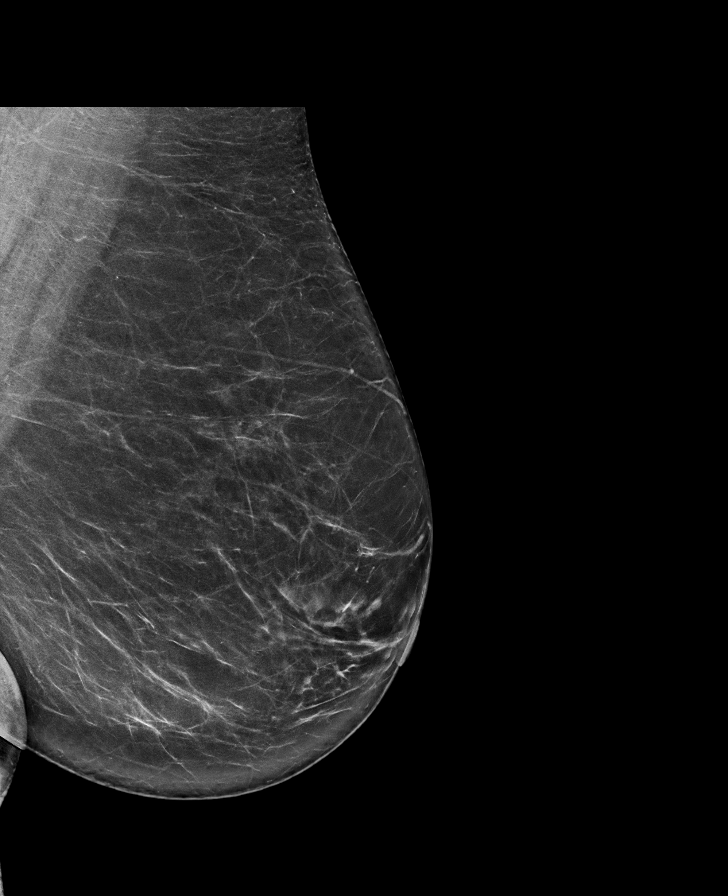

[R MLO synth-2D]
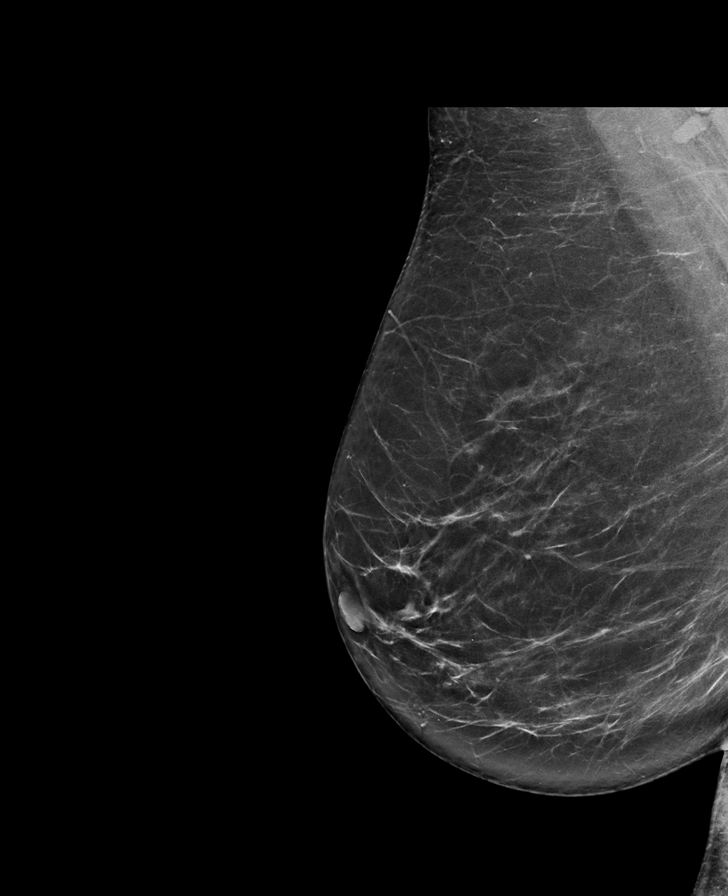

[R CC synth-2D]
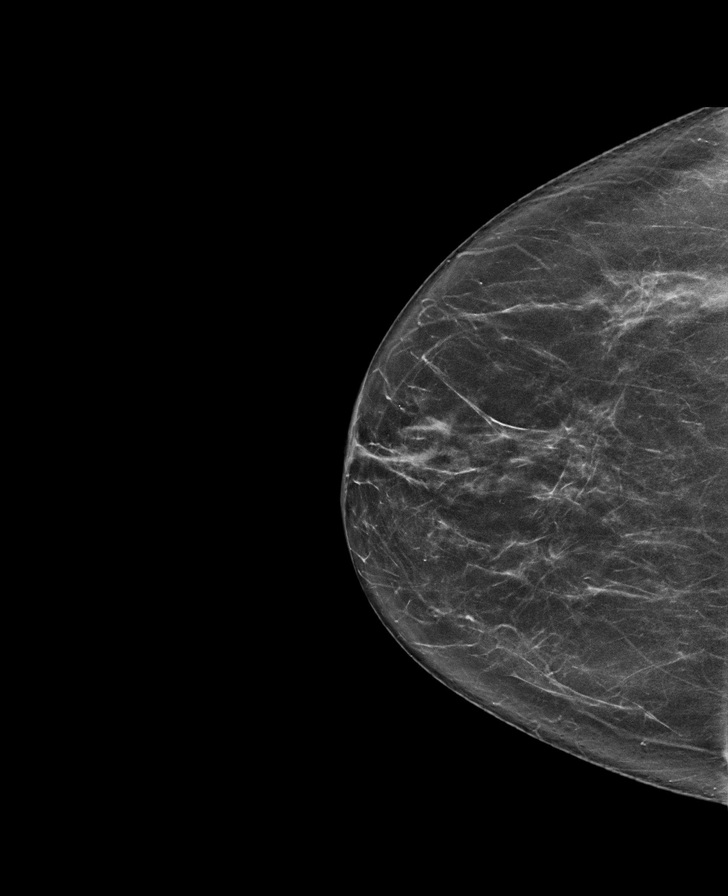

[L CC synth-2D]
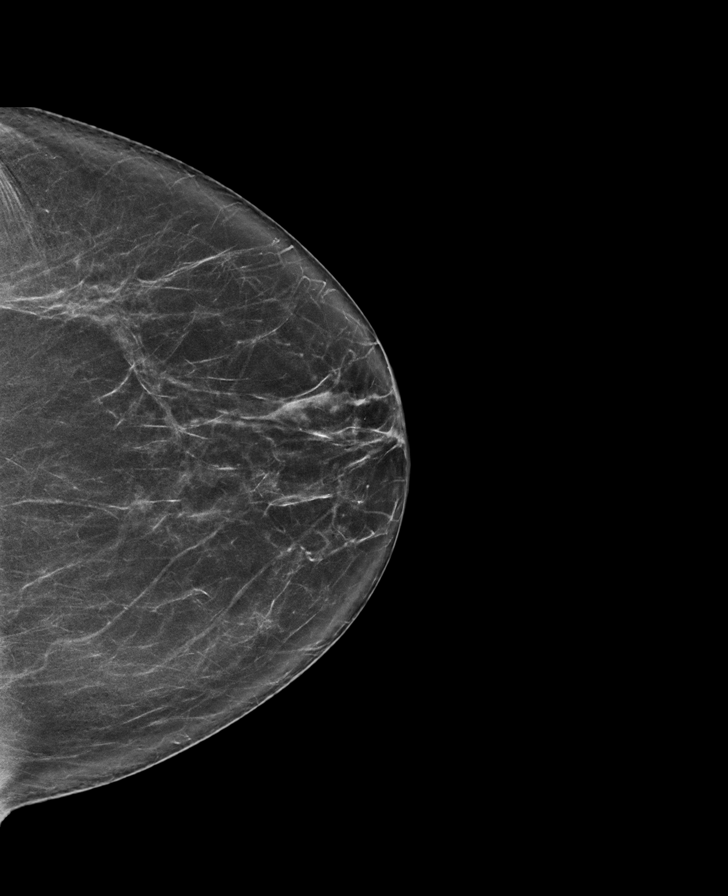

[L MLO tomo · tomo slice 42/83.0]
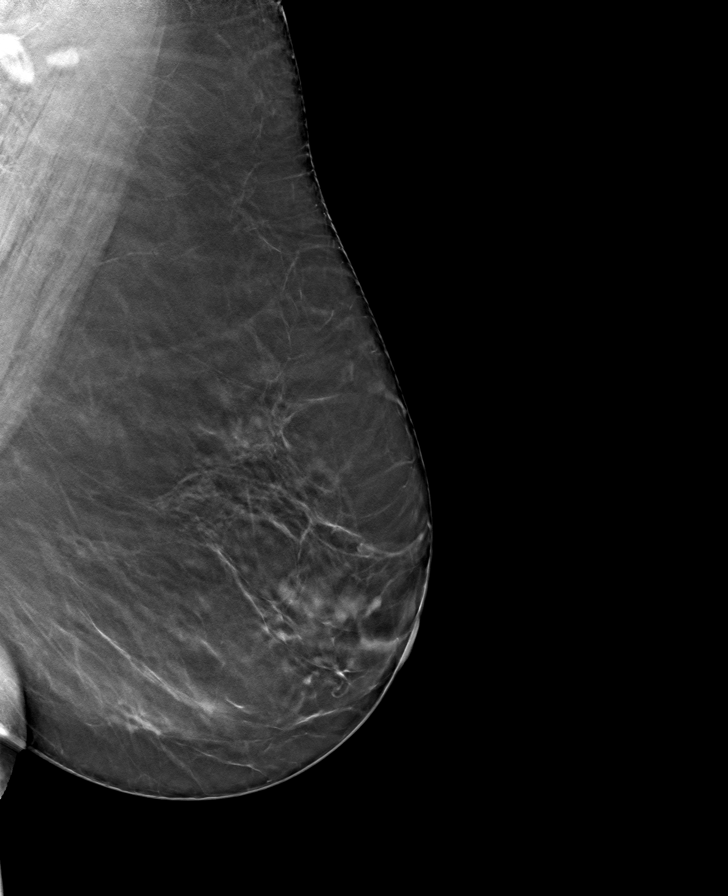

[L CC tomo · tomo slice 39/78.0]
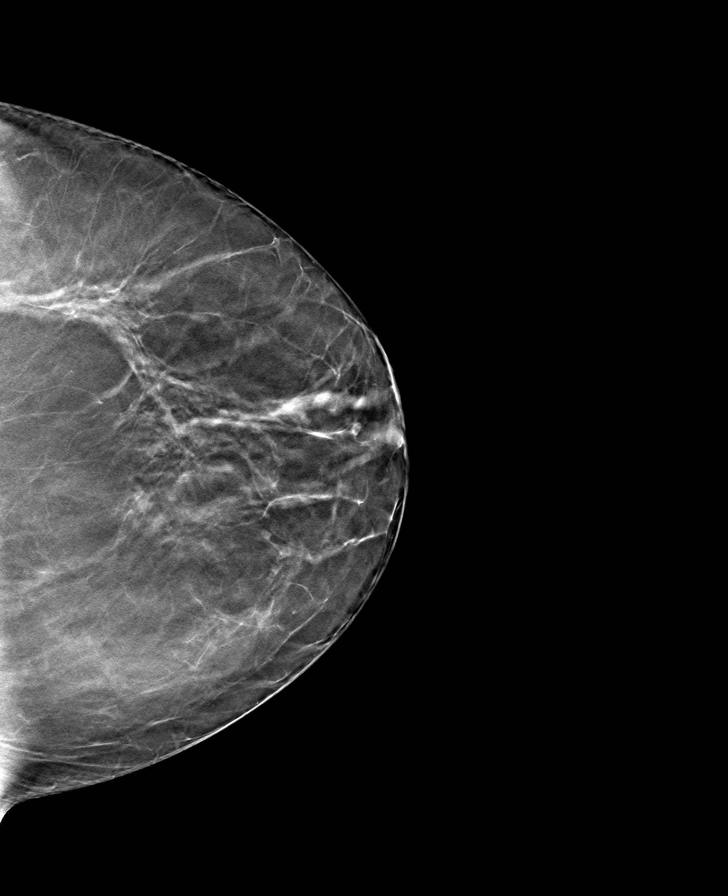

[R CC tomo · tomo slice 37/73.0]
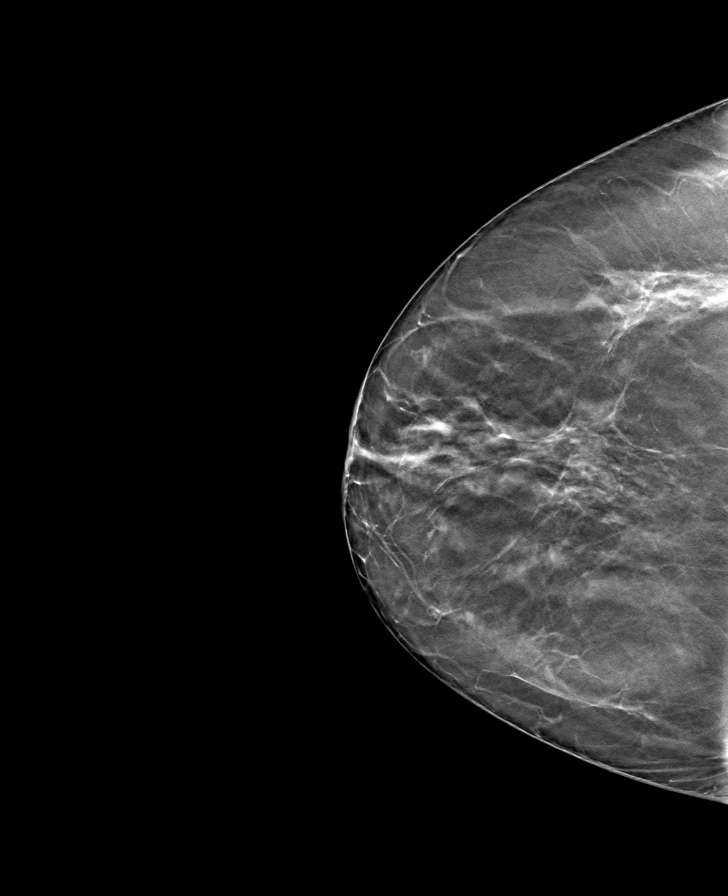

[R MLO tomo · tomo slice 40/79.0]
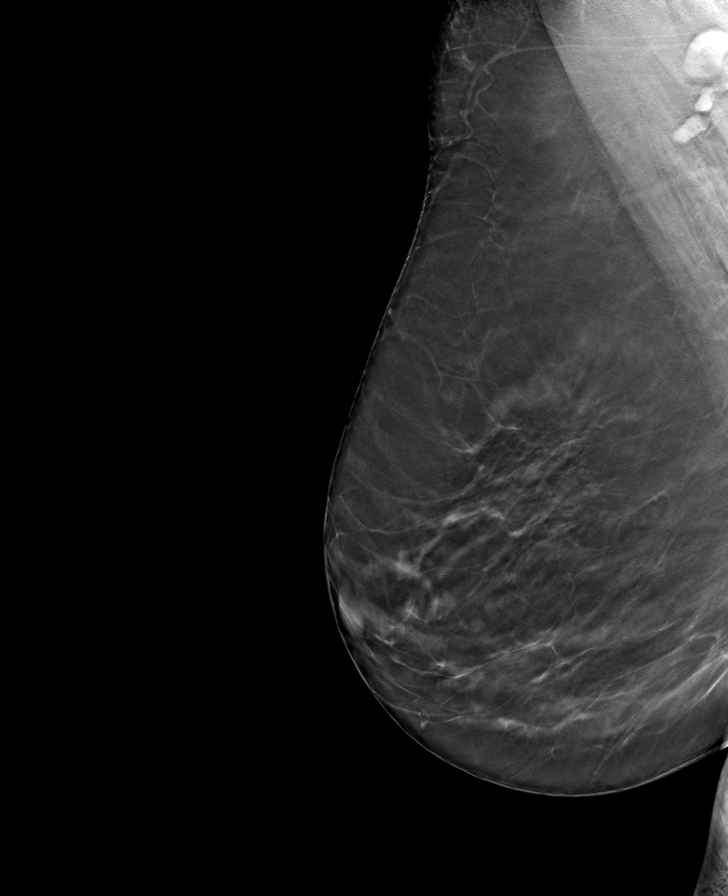

[8 of 24 positions shown; findings below may reference images not displayed]

ACR Breast Density Category b: There are scattered areas of
fibroglandular density.
FINDINGS: There are no findings suspicious for malignancy. Images were
processed with CAD.
IMPRESSION: No mammographic evidence of malignancy. A result letter of this
screening mammogram will be mailed directly to the patient.

RECOMMENDATION:
Screening mammogram in one year. (Code:[id number])

BI-RADS CATEGORY  1: Negative.

## 2019-10-13 ENCOUNTER — Ambulatory Visit: Payer: PPO | Attending: Internal Medicine

## 2019-10-13 DIAGNOSIS — Z20822 Contact with and (suspected) exposure to covid-19: Secondary | ICD-10-CM

## 2019-10-14 LAB — NOVEL CORONAVIRUS, NAA: SARS-CoV-2, NAA: NOT DETECTED

## 2019-11-19 ENCOUNTER — Encounter: Payer: 59 | Admitting: Obstetrics & Gynecology

## 2019-12-14 DIAGNOSIS — H43812 Vitreous degeneration, left eye: Secondary | ICD-10-CM | POA: Diagnosis not present

## 2019-12-23 ENCOUNTER — Other Ambulatory Visit: Payer: Self-pay

## 2019-12-24 ENCOUNTER — Encounter: Payer: PPO | Admitting: Obstetrics & Gynecology

## 2019-12-31 ENCOUNTER — Other Ambulatory Visit: Payer: Self-pay

## 2020-01-01 ENCOUNTER — Encounter: Payer: Self-pay | Admitting: Obstetrics & Gynecology

## 2020-01-01 ENCOUNTER — Ambulatory Visit (INDEPENDENT_AMBULATORY_CARE_PROVIDER_SITE_OTHER): Payer: PPO | Admitting: Obstetrics & Gynecology

## 2020-01-01 VITALS — BP 122/80 | Ht 64.5 in | Wt 158.0 lb

## 2020-01-01 DIAGNOSIS — Z13820 Encounter for screening for osteoporosis: Secondary | ICD-10-CM

## 2020-01-01 DIAGNOSIS — Z01419 Encounter for gynecological examination (general) (routine) without abnormal findings: Secondary | ICD-10-CM | POA: Diagnosis not present

## 2020-01-01 DIAGNOSIS — Z780 Asymptomatic menopausal state: Secondary | ICD-10-CM

## 2020-01-01 NOTE — Patient Instructions (Signed)
1. Encounter for routine gynecological examination with Papanicolaou smear of cervix Normal gynecologic exam in menopause.  Pap reflex done.  Breast exam normal.  Screening mammogram negative December 2020.  Colonoscopy 2 repeat this year.  Health labs with family physician.  Body mass index good at 26.7.  Continue with fitness and healthy nutrition.  2. Postmenopausal Well on no hormone replacement therapy.  No postmenopausal bleeding.  3. Screening for osteoporosis We will schedule a bone density here now.  Continue with vitamin D supplements, calcium intake of 1200 mg daily and regular weightbearing physical activities. - DG Bone Density; Future  Other orders - cholecalciferol (VITAMIN D3) 25 MCG (1000 UNIT) tablet; Take 1,000 Units by mouth daily.  Nicole Hall, it was a pleasure seeing you today!  I will inform you of your results as soon as they are available.

## 2020-01-01 NOTE — Addendum Note (Signed)
Addended by: Thurnell Garbe A on: 01/01/2020 10:30 AM   Modules accepted: Orders

## 2020-01-01 NOTE — Progress Notes (Signed)
Nicole Hall [birth year] ID:[id number]   History:    64 y.o. [id number] Married.  3 Sons, GM of 5.  Adopted, found her biologic family through 68 and me!  :-)  RP:  Established patient presenting for annual gyn exam   HPI: Menopause, well on no hormone replacement therapy.  No postmenopausal bleeding.  No pelvic pain.  No pain with intercourse.  Normal vaginal secretions.  Breasts normal.  Urine and bowel movements normal.  Body mass index 26.7.  Very active, exercising regularly.  Healthy nutrition.  Health labs with family physician.  Past medical history,surgical history, family history and social history were all reviewed and documented in the EPIC chart.  Gynecologic History No LMP recorded. Patient is postmenopausal.  Obstetric History OB History  Gravida Para Term Preterm AB Living  4 3       3   SAB TAB Ectopic Multiple Live Births               # Outcome Date GA Lbr Len/2nd Weight Sex Delivery Anes PTL Lv  4 Gravida           3 Para           2 Para           1 Para              ROS: A ROS was performed and pertinent positives and negatives are included in the history.  GENERAL: No fevers or chills. HEENT: No change in vision, no earache, sore throat or sinus congestion. NECK: No pain or stiffness. CARDIOVASCULAR: No chest pain or pressure. No palpitations. PULMONARY: No shortness of breath, cough or wheeze. GASTROINTESTINAL: No abdominal pain, nausea, vomiting or diarrhea, melena or bright red blood per rectum. GENITOURINARY: No urinary frequency, urgency, hesitancy or dysuria. MUSCULOSKELETAL: No joint or muscle pain, no back pain, no recent trauma. DERMATOLOGIC: No rash, no itching, no lesions. ENDOCRINE: No polyuria, polydipsia, no heat or cold intolerance. No recent change in weight. HEMATOLOGICAL: No anemia or easy bruising or bleeding. NEUROLOGIC: No headache, seizures, numbness, tingling or weakness. PSYCHIATRIC: No depression, no loss of interest in normal activity or  change in sleep pattern.     Exam:   BP 122/80   Ht 5' 4.5" (1.638 m)   Wt 158 lb (71.7 kg)   BMI 26.70 kg/m   Body mass index is 26.7 kg/m.  General appearance : Well developed well nourished female. No acute distress HEENT: Eyes: no retinal hemorrhage or exudates,  Neck supple, trachea midline, no carotid bruits, no thyroidmegaly Lungs: Clear to auscultation, no rhonchi or wheezes, or rib retractions  Heart: Regular rate and rhythm, no murmurs or gallops Breast:Examined in sitting and supine position were symmetrical in appearance, no palpable masses or tenderness,  no skin retraction, no nipple inversion, no nipple discharge, no skin discoloration, no axillary or supraclavicular lymphadenopathy Abdomen: no palpable masses or tenderness, no rebound or guarding Extremities: no edema or skin discoloration or tenderness  Pelvic: Vulva: Normal             Vagina: No gross lesions or discharge  Cervix: No gross lesions or discharge.  Pap reflex done.  Uterus  AV, normal size, shape and consistency, non-tender and mobile  Adnexa  Without masses or tenderness  Anus: Normal   Assessment/Plan:  64 y.o. female for annual exam   1. Encounter for routine gynecological examination with Papanicolaou smear of cervix Normal gynecologic exam in menopause.  Pap reflex  done.  Breast exam normal.  Screening mammogram negative December 2020.  Colonoscopy 2 repeat this year.  Health labs with family physician.  Body mass index good at 26.7.  Continue with fitness and healthy nutrition.  2. Postmenopausal Well on no hormone replacement therapy.  No postmenopausal bleeding.  3. Screening for osteoporosis We will schedule a bone density here now.  Continue with vitamin D supplements, calcium intake of 1200 mg daily and regular weightbearing physical activities. - DG Bone Density; Future  Other orders - cholecalciferol (VITAMIN D3) 25 MCG (1000 UNIT) tablet; Take 1,000 Units by mouth daily.   Princess Bruins MD, 10:09 AM 01/01/2020

## 2020-01-04 LAB — PAP IG W/ RFLX HPV ASCU

## 2020-01-05 ENCOUNTER — Encounter: Payer: Self-pay | Admitting: *Deleted

## 2020-01-26 ENCOUNTER — Ambulatory Visit (INDEPENDENT_AMBULATORY_CARE_PROVIDER_SITE_OTHER): Payer: PPO

## 2020-01-26 ENCOUNTER — Other Ambulatory Visit: Payer: Self-pay | Admitting: Obstetrics & Gynecology

## 2020-01-26 ENCOUNTER — Other Ambulatory Visit: Payer: Self-pay

## 2020-01-26 DIAGNOSIS — Z13820 Encounter for screening for osteoporosis: Secondary | ICD-10-CM

## 2020-01-26 DIAGNOSIS — Z780 Asymptomatic menopausal state: Secondary | ICD-10-CM

## 2020-01-26 DIAGNOSIS — M8588 Other specified disorders of bone density and structure, other site: Secondary | ICD-10-CM | POA: Diagnosis not present

## 2020-02-05 ENCOUNTER — Encounter: Payer: Self-pay | Admitting: Gynecology

## 2020-02-25 NOTE — Telephone Encounter (Signed)
Spoke with patient and read her this My Chart result note.

## 2020-04-21 DIAGNOSIS — Z1211 Encounter for screening for malignant neoplasm of colon: Secondary | ICD-10-CM | POA: Diagnosis not present

## 2020-04-21 DIAGNOSIS — Z1331 Encounter for screening for depression: Secondary | ICD-10-CM | POA: Diagnosis not present

## 2020-04-21 DIAGNOSIS — E663 Overweight: Secondary | ICD-10-CM | POA: Diagnosis not present

## 2020-04-21 DIAGNOSIS — Z6826 Body mass index (BMI) 26.0-26.9, adult: Secondary | ICD-10-CM | POA: Diagnosis not present

## 2020-04-21 DIAGNOSIS — Z139 Encounter for screening, unspecified: Secondary | ICD-10-CM | POA: Diagnosis not present

## 2020-04-21 DIAGNOSIS — Z0000 Encounter for general adult medical examination without abnormal findings: Secondary | ICD-10-CM | POA: Diagnosis not present

## 2020-05-10 ENCOUNTER — Other Ambulatory Visit: Payer: Self-pay

## 2020-05-10 ENCOUNTER — Encounter: Payer: Self-pay | Admitting: Dermatology

## 2020-05-10 ENCOUNTER — Ambulatory Visit: Payer: PPO | Admitting: Dermatology

## 2020-05-10 DIAGNOSIS — D225 Melanocytic nevi of trunk: Secondary | ICD-10-CM

## 2020-05-10 DIAGNOSIS — D171 Benign lipomatous neoplasm of skin and subcutaneous tissue of trunk: Secondary | ICD-10-CM

## 2020-05-10 DIAGNOSIS — L719 Rosacea, unspecified: Secondary | ICD-10-CM

## 2020-05-10 DIAGNOSIS — D229 Melanocytic nevi, unspecified: Secondary | ICD-10-CM

## 2020-05-12 DIAGNOSIS — K828 Other specified diseases of gallbladder: Secondary | ICD-10-CM | POA: Diagnosis not present

## 2020-05-12 DIAGNOSIS — E663 Overweight: Secondary | ICD-10-CM | POA: Diagnosis not present

## 2020-05-12 DIAGNOSIS — H9193 Unspecified hearing loss, bilateral: Secondary | ICD-10-CM | POA: Diagnosis not present

## 2020-05-12 DIAGNOSIS — Z23 Encounter for immunization: Secondary | ICD-10-CM | POA: Diagnosis not present

## 2020-05-12 DIAGNOSIS — Z6825 Body mass index (BMI) 25.0-25.9, adult: Secondary | ICD-10-CM | POA: Diagnosis not present

## 2020-05-12 DIAGNOSIS — R0600 Dyspnea, unspecified: Secondary | ICD-10-CM | POA: Diagnosis not present

## 2020-05-12 DIAGNOSIS — E785 Hyperlipidemia, unspecified: Secondary | ICD-10-CM | POA: Diagnosis not present

## 2020-06-21 NOTE — Progress Notes (Signed)
° °  Follow-Up Visit   Subjective  Nicole Hall is a 64 y.o. female who presents for the following: Follow-up (Pt stated--spot on the butt--8 years. Denied pain.).  Growth Location:  Duration: Years Quality:  Associated Signs/Symptoms: Modifying Factors:  Severity:  Timing: Context:   Objective  Well appearing patient in no apparent distress; mood and affect are within normal limits.  A focused examination was performed including Legs, buttocks, abdomen, back, arms, head and neck.. Relevant physical exam findings are noted in the Assessment and Plan.   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  Rosacea Mid Tip of Nose  Called rosacea mainly lower nose undoubtedly aggravated by use of Covid mask.  Right now it is not bothersome but if this gets worse she can call me and we will phone in a prescription for generic Soolantra cream.  Nevus Mid Back  Self examine skin twice annually     I, Lavonna Monarch, MD, have reviewed all documentation for this visit.  The documentation on 06/21/20 for the exam, diagnosis, procedures, and orders are all accurate and complete.

## 2020-07-28 DIAGNOSIS — H9193 Unspecified hearing loss, bilateral: Secondary | ICD-10-CM | POA: Diagnosis not present

## 2020-07-28 DIAGNOSIS — R0600 Dyspnea, unspecified: Secondary | ICD-10-CM | POA: Diagnosis not present

## 2020-07-28 DIAGNOSIS — J208 Acute bronchitis due to other specified organisms: Secondary | ICD-10-CM | POA: Diagnosis not present

## 2020-07-28 DIAGNOSIS — Z6825 Body mass index (BMI) 25.0-25.9, adult: Secondary | ICD-10-CM | POA: Diagnosis not present

## 2020-07-28 DIAGNOSIS — E663 Overweight: Secondary | ICD-10-CM | POA: Diagnosis not present

## 2020-07-28 DIAGNOSIS — K828 Other specified diseases of gallbladder: Secondary | ICD-10-CM | POA: Diagnosis not present

## 2020-07-28 DIAGNOSIS — E785 Hyperlipidemia, unspecified: Secondary | ICD-10-CM | POA: Diagnosis not present

## 2020-07-28 DIAGNOSIS — Z9181 History of falling: Secondary | ICD-10-CM | POA: Diagnosis not present

## 2020-07-28 DIAGNOSIS — B9689 Other specified bacterial agents as the cause of diseases classified elsewhere: Secondary | ICD-10-CM | POA: Diagnosis not present

## 2020-09-23 ENCOUNTER — Other Ambulatory Visit: Payer: Self-pay | Admitting: Obstetrics & Gynecology

## 2020-09-23 DIAGNOSIS — Z1231 Encounter for screening mammogram for malignant neoplasm of breast: Secondary | ICD-10-CM

## 2020-09-28 DIAGNOSIS — R059 Cough, unspecified: Secondary | ICD-10-CM | POA: Diagnosis not present

## 2020-11-01 DIAGNOSIS — E785 Hyperlipidemia, unspecified: Secondary | ICD-10-CM | POA: Diagnosis not present

## 2020-11-01 DIAGNOSIS — H9193 Unspecified hearing loss, bilateral: Secondary | ICD-10-CM | POA: Diagnosis not present

## 2020-11-01 DIAGNOSIS — E663 Overweight: Secondary | ICD-10-CM | POA: Diagnosis not present

## 2020-11-01 DIAGNOSIS — Z6825 Body mass index (BMI) 25.0-25.9, adult: Secondary | ICD-10-CM | POA: Diagnosis not present

## 2020-11-01 DIAGNOSIS — K828 Other specified diseases of gallbladder: Secondary | ICD-10-CM | POA: Diagnosis not present

## 2020-11-01 DIAGNOSIS — R0600 Dyspnea, unspecified: Secondary | ICD-10-CM | POA: Diagnosis not present

## 2020-11-03 ENCOUNTER — Ambulatory Visit
Admission: RE | Admit: 2020-11-03 | Discharge: 2020-11-03 | Disposition: A | Discharge status: Home / Self Care | Payer: PPO | Source: Ambulatory Visit | Attending: Obstetrics & Gynecology | Admitting: Obstetrics & Gynecology

## 2020-11-03 ENCOUNTER — Other Ambulatory Visit: Payer: Self-pay

## 2020-11-03 DIAGNOSIS — Z1231 Encounter for screening mammogram for malignant neoplasm of breast: Secondary | ICD-10-CM | POA: Diagnosis not present

## 2021-01-02 DIAGNOSIS — Z01812 Encounter for preprocedural laboratory examination: Secondary | ICD-10-CM | POA: Diagnosis not present

## 2021-01-03 ENCOUNTER — Encounter: Payer: Self-pay | Admitting: Obstetrics & Gynecology

## 2021-01-03 ENCOUNTER — Ambulatory Visit (INDEPENDENT_AMBULATORY_CARE_PROVIDER_SITE_OTHER): Payer: PPO | Admitting: Obstetrics & Gynecology

## 2021-01-03 ENCOUNTER — Other Ambulatory Visit: Payer: Self-pay

## 2021-01-03 VITALS — BP 118/78 | Ht 64.0 in | Wt 155.0 lb

## 2021-01-03 DIAGNOSIS — Z01419 Encounter for gynecological examination (general) (routine) without abnormal findings: Secondary | ICD-10-CM

## 2021-01-03 DIAGNOSIS — M8588 Other specified disorders of bone density and structure, other site: Secondary | ICD-10-CM

## 2021-01-03 DIAGNOSIS — Z780 Asymptomatic menopausal state: Secondary | ICD-10-CM

## 2021-01-03 NOTE — Progress Notes (Signed)
Nicole Hall 1956-02-22 [id number]   History:    65 y.o.  [id number] Married. 3 Sons, GM of 5. Adopted, found her biologic family through 53 and me! :-)  GD:[id number] presenting for annual gyn exam   XQJ:[id number], well on no hormone replacement therapy. No postmenopausal bleeding. No pelvic pain. No pain with intercourse. Normal vaginal secretions. Breasts normal. Urine and bowel movements normal. Body mass index 26.61. Very active, exercising regularly. Healthy nutrition. Health labs with family physician.   Past medical history,surgical history, family history and social history were all reviewed and documented in the EPIC chart.  Gynecologic History No LMP recorded. Patient is postmenopausal.  Obstetric History OB History  Gravida Para Term Preterm AB Living  4 3     1 3   SAB IAB Ectopic Multiple Live Births  1            # Outcome Date GA Lbr Len/2nd Weight Sex Delivery Anes PTL Lv  4 SAB           3 Para           2 Para           1 Para              ROS: A ROS was performed and pertinent positives and negatives are included in the history.  GENERAL: No fevers or chills. HEENT: No change in vision, no earache, sore throat or sinus congestion. NECK: No pain or stiffness. CARDIOVASCULAR: No chest pain or pressure. No palpitations. PULMONARY: No shortness of breath, cough or wheeze. GASTROINTESTINAL: No abdominal pain, nausea, vomiting or diarrhea, melena or bright red blood per rectum. GENITOURINARY: No urinary frequency, urgency, hesitancy or dysuria. MUSCULOSKELETAL: No joint or muscle pain, no back pain, no recent trauma. DERMATOLOGIC: No rash, no itching, no lesions. ENDOCRINE: No polyuria, polydipsia, no heat or cold intolerance. No recent change in weight. HEMATOLOGICAL: No anemia or easy bruising or bleeding. NEUROLOGIC: No headache, seizures, numbness, tingling or weakness. PSYCHIATRIC: No depression, no loss of interest in normal activity  or change in sleep pattern.     Exam:   BP 118/78 (BP Location: Right Arm, Patient Position: Sitting, Cuff Size: Normal)   Ht 5\' 4"  (1.626 m)   Wt 155 lb (70.3 kg)   BMI 26.61 kg/m   Body mass index is 26.61 kg/m.  General appearance : Well developed well nourished female. No acute distress HEENT: Eyes: no retinal hemorrhage or exudates,  Neck supple, trachea midline, no carotid bruits, no thyroidmegaly Lungs: Clear to auscultation, no rhonchi or wheezes, or rib retractions  Heart: Regular rate and rhythm, no murmurs or gallops Breast:Examined in sitting and supine position were symmetrical in appearance, no palpable masses or tenderness,  no skin retraction, no nipple inversion, no nipple discharge, no skin discoloration, no axillary or supraclavicular lymphadenopathy Abdomen: no palpable masses or tenderness, no rebound or guarding Extremities: no edema or skin discoloration or tenderness  Pelvic: Vulva: Normal             Vagina: No gross lesions or discharge  Cervix: No gross lesions or discharge  Uterus  AV, normal size, shape and consistency, non-tender and mobile  Adnexa  Without masses or tenderness  Anus: Normal   Assessment/Plan:  65 y.o. female for annual exam   1. Well female exam with routine gynecological exam Normal gynecologic exam in menopause.  No indication for Pap test this year.  Breast exam normal.  Will schedule a colonoscopy  this year.  Health labs with family physician.  Body mass index 26.61.  Continue with fitness and healthy nutrition.  2. Postmenopausal Well on no hormone replacement therapy.  No postmenopausal bleeding.  3. Osteopenia of lumbar spine Osteopenia on bone density April 2021.  Will repeat at 2 years.  Vitamin D supplements, calcium intake of 1.5 g/day total and regular weightbearing physical activity is recommended.  Other orders - atorvastatin (LIPITOR) 40 MG tablet; Take 1 tablet by mouth daily.  Princess Bruins MD, 1:52 PM  01/03/2021

## 2021-01-05 ENCOUNTER — Encounter: Payer: Self-pay | Admitting: Obstetrics & Gynecology

## 2021-01-05 DIAGNOSIS — Z1211 Encounter for screening for malignant neoplasm of colon: Secondary | ICD-10-CM | POA: Diagnosis not present

## 2021-01-05 DIAGNOSIS — D122 Benign neoplasm of ascending colon: Secondary | ICD-10-CM | POA: Diagnosis not present

## 2021-01-10 DIAGNOSIS — D122 Benign neoplasm of ascending colon: Secondary | ICD-10-CM | POA: Diagnosis not present

## 2021-02-01 DIAGNOSIS — Z9181 History of falling: Secondary | ICD-10-CM | POA: Diagnosis not present

## 2021-02-01 DIAGNOSIS — Z1331 Encounter for screening for depression: Secondary | ICD-10-CM | POA: Diagnosis not present

## 2021-02-01 DIAGNOSIS — E785 Hyperlipidemia, unspecified: Secondary | ICD-10-CM | POA: Diagnosis not present

## 2021-02-01 DIAGNOSIS — Z0000 Encounter for general adult medical examination without abnormal findings: Secondary | ICD-10-CM | POA: Diagnosis not present

## 2021-02-07 DIAGNOSIS — E785 Hyperlipidemia, unspecified: Secondary | ICD-10-CM | POA: Diagnosis not present

## 2021-02-07 DIAGNOSIS — R0600 Dyspnea, unspecified: Secondary | ICD-10-CM | POA: Diagnosis not present

## 2021-02-07 DIAGNOSIS — Z6826 Body mass index (BMI) 26.0-26.9, adult: Secondary | ICD-10-CM | POA: Diagnosis not present

## 2021-02-07 DIAGNOSIS — H9193 Unspecified hearing loss, bilateral: Secondary | ICD-10-CM | POA: Diagnosis not present

## 2021-02-07 DIAGNOSIS — E663 Overweight: Secondary | ICD-10-CM | POA: Diagnosis not present

## 2021-02-07 DIAGNOSIS — K828 Other specified diseases of gallbladder: Secondary | ICD-10-CM | POA: Diagnosis not present

## 2021-04-03 NOTE — Progress Notes (Signed)
Heart and Vascular at [facility]  Cardiology Office Note:    Date:  04/05/2021   ID:  Nicole Hall, DOB Aug 04, 1956, MRN [id number]  PCP:  Nicole Nakai, MD   [facility] Providers Cardiologist:  None {    Referring MD: Nicole Nakai, MD    History of Present Illness:    Nicole Hall is a 65 y.o. female with a hx of HLD and arthritis who was referred by Dr. Truman Hall for further evaluation of dyspnea on exertion.  Today, the patient states that she has had ongoing dyspnea on exertion when walking stairs or up an incline. This has been constant and ongoing for years. No associated chest pain, lightheadedness, palpitations, PND, nausea or vomiting. Has chronic trace LE edema with right>left which is stable. Has been told she has aortic plaque on ultrasound in the past which she wanted to discuss further. Notably has been on atorvastatin for very high LDL (reportedly 200s in the past) which is now improved to 92.  Patient is adopted and recently found family. Mother with "mild heart disease" and father with MI later in life. Details unknown.  No past medical history on file.  Past Surgical History:  Procedure Laterality Date   KNEE SURGERY Left     Current Medications: Current Meds  Medication Sig   atorvastatin (LIPITOR) 40 MG tablet Take 1 tablet by mouth daily.   Cholecalciferol (VITAMIN D) 50 MCG (2000 UT) CAPS Take 1 capsule by mouth daily.     Allergies:   Patient has no known allergies.   Social History   Socioeconomic History   Marital status: Married    Spouse name: Not on file   Number of children: Not on file   Years of education: Not on file   Highest education level: Not on file  Occupational History   Not on file  Tobacco Use   Smoking status: Never   Smokeless tobacco: Never  Vaping Use   Vaping Use: Never used  Substance and Sexual Activity   Alcohol use: Yes    Comment: WINE   Drug use: No   Sexual activity: Yes    Partners: Male     Comment: 1ST INTERCOURSE- 36 , PARTNERS- 3, MARRIED- 20 YRS   Other Topics Concern   Not on file  Social History Narrative   Not on file   Social Determinants of Health   Financial Resource Strain: Not on file  Food Insecurity: Not on file  Transportation Needs: Not on file  Physical Activity: Not on file  Stress: Not on file  Social Connections: Not on file     Family History: The patient's family history is not on file. She was adopted.  ROS:   Please see the history of present illness.    Review of Systems  Constitutional:  Negative for chills and fever.  HENT:  Negative for hearing loss.   Eyes:  Negative for blurred vision.  Respiratory:  Positive for shortness of breath.   Cardiovascular:  Positive for leg swelling. Negative for chest pain, palpitations, orthopnea, claudication and PND.  Gastrointestinal:  Negative for nausea and vomiting.  Genitourinary:  Negative for hematuria.  Musculoskeletal:  Negative for falls.  Neurological:  Negative for dizziness and loss of consciousness.  Psychiatric/Behavioral:  Negative for substance abuse.     EKGs/Labs/Other Studies Reviewed:    The following studies were reviewed today:   EKG:  EKG is  ordered today.  The ekg ordered  today demonstrates NSR with HR 65  Recent Labs: No results found for requested labs within last 8760 hours.  Recent Lipid Panel No results found for: CHOL, TRIG, HDL, CHOLHDL, VLDL, LDLCALC, LDLDIRECT        Physical Exam:    VS:  BP 122/74   Pulse 65   Ht 5\' 4"  (1.626 m)   Wt 155 lb 3.2 oz (70.4 kg)   SpO2 96%   BMI 26.64 kg/m     Wt Readings from Last 3 Encounters:  04/05/21 155 lb 3.2 oz (70.4 kg)  01/03/21 155 lb (70.3 kg)  01/01/20 158 lb (71.7 kg)     GEN:  Well nourished, well developed in no acute distress HEENT: Normal NECK: No JVD; No carotid bruits CARDIAC: RRR, no murmurs, rubs, gallops RESPIRATORY:  Clear to auscultation without rales, wheezing or rhonchi   ABDOMEN: Soft, non-tender, non-distended MUSCULOSKELETAL:  No edema; No deformity  SKIN: Warm and dry NEUROLOGIC:  Alert and oriented x 3 PSYCHIATRIC:  Normal affect   ASSESSMENT:    1. SOB (shortness of breath)   2. Hyperlipidemia, unspecified hyperlipidemia type   3. Family history of early CAD    PLAN:    In order of problems listed above:  #Dyspnea on Exertion: Chronic and ongoing issue for years. Occurs with walking stairs and walking up an incline. Not worsening and patient denies any related chest pain, SOB, orthopnea or PND. She is overall helathy and active at baseline. No known personal history of CAD. -Check TTE -Coronary calcium score for risk stratification -If significantly elevated, will plan to pursue myoview at that time  #HLD: #Aortic atherosclerosis Much better controlled with LDL 92. Previously very elevated in the past with LDL 200 per patient report. -Continue lipitor 40mg  daily -Coronary calcium score as above and can adjust statin as needed    Medication Adjustments/Labs and Tests Ordered: Current medicines are reviewed at length with the patient today.  Concerns regarding medicines are outlined above.  Orders Placed This Encounter  Procedures   CT CARDIAC SCORING (SELF PAY ONLY)   EKG 12-Lead   ECHOCARDIOGRAM COMPLETE    No orders of the defined types were placed in this encounter.   Patient Instructions  Medication Instructions:   Your physician recommends that you continue on your current medications as directed. Please refer to the Current Medication list given to you today.  *If you need a refill on your cardiac medications before your next appointment, please call your pharmacy*  Testing/Procedures:  Your physician has requested that you have an echocardiogram. Echocardiography is a painless test that uses sound waves to create images of your heart. It provides your doctor with information about the size and shape of your heart and  how well your heart's chambers and valves are working. This procedure takes approximately one hour. There are no restrictions for this procedure.  CARDIAC CALCIUM SCORE TO BE DONE AT OUR [facility] LOCATION--SELF PAY   Follow-Up: At [facility], you and your health needs are our priority.  As part of our continuing mission to provide you with exceptional heart care, we have created designated Provider Care Teams.  These Care Teams include your primary Cardiologist (physician) and Advanced Practice Providers (APPs -  Physician Assistants and Nurse Practitioners) who all work together to provide you with the care you need, when you need it.  We recommend signing up for the patient portal called "MyChart".  Sign up information is provided on this After Visit Summary.  MyChart is used to connect with patients for Virtual Visits (Telemedicine).  Patients are able to view lab/test results, encounter notes, upcoming appointments, etc.  Non-urgent messages can be sent to your provider as well.   To learn more about what you can do with MyChart, go to [email or web address].    Your next appointment:   6 month(s)  The format for your next appointment:   In Person  Provider:   You may see DR. Brayli Klingbeil or one of the following Advanced Practice Providers on your designated Care Team:   Richardson Dopp, PA-C Robbie Lis, [place]     Signed, Freada Bergeron, MD  04/05/2021 12:25 PM

## 2021-04-05 ENCOUNTER — Ambulatory Visit (HOSPITAL_BASED_OUTPATIENT_CLINIC_OR_DEPARTMENT_OTHER): Payer: PPO | Admitting: Cardiology

## 2021-04-05 ENCOUNTER — Other Ambulatory Visit: Payer: Self-pay

## 2021-04-05 ENCOUNTER — Encounter (HOSPITAL_BASED_OUTPATIENT_CLINIC_OR_DEPARTMENT_OTHER): Payer: Self-pay | Admitting: Cardiology

## 2021-04-05 VITALS — BP 122/74 | HR 65 | Ht 64.0 in | Wt 155.2 lb

## 2021-04-05 DIAGNOSIS — Z8249 Family history of ischemic heart disease and other diseases of the circulatory system: Secondary | ICD-10-CM

## 2021-04-05 DIAGNOSIS — E785 Hyperlipidemia, unspecified: Secondary | ICD-10-CM

## 2021-04-05 DIAGNOSIS — R0602 Shortness of breath: Secondary | ICD-10-CM | POA: Diagnosis not present

## 2021-04-05 NOTE — Patient Instructions (Signed)
Medication Instructions:   Your physician recommends that you continue on your current medications as directed. Please refer to the Current Medication list given to you today.  *If you need a refill on your cardiac medications before your next appointment, please call your pharmacy*  Testing/Procedures:  Your physician has requested that you have an echocardiogram. Echocardiography is a painless test that uses sound waves to create images of your heart. It provides your doctor with information about the size and shape of your heart and how well your heart's chambers and valves are working. This procedure takes approximately one hour. There are no restrictions for this procedure.  CARDIAC CALCIUM SCORE TO BE DONE AT OUR [facility] LOCATION--SELF PAY   Follow-Up: At [facility], you and your health needs are our priority.  As part of our continuing mission to provide you with exceptional heart care, we have created designated Provider Care Teams.  These Care Teams include your primary Cardiologist (physician) and Advanced Practice Providers (APPs -  Physician Assistants and Nurse Practitioners) who all work together to provide you with the care you need, when you need it.  We recommend signing up for the patient portal called "MyChart".  Sign up information is provided on this After Visit Summary.  MyChart is used to connect with patients for Virtual Visits (Telemedicine).  Patients are able to view lab/test results, encounter notes, upcoming appointments, etc.  Non-urgent messages can be sent to your provider as well.   To learn more about what you can do with MyChart, go to [email or web address].    Your next appointment:   6 month(s)  The format for your next appointment:   In Person  Provider:   You may see DR. PEMBERTON or one of the following Advanced Practice Providers on your designated Care Team:   Richardson Dopp, PA-C Vin Mansura, [place]

## 2021-04-25 ENCOUNTER — Ambulatory Visit (HOSPITAL_COMMUNITY): Payer: PPO | Attending: Cardiology

## 2021-04-25 ENCOUNTER — Ambulatory Visit (INDEPENDENT_AMBULATORY_CARE_PROVIDER_SITE_OTHER)
Admission: RE | Admit: 2021-04-25 | Discharge: 2021-04-25 | Disposition: A | Discharge status: Home / Self Care | Payer: Self-pay | Source: Ambulatory Visit | Attending: Cardiology | Admitting: Cardiology

## 2021-04-25 ENCOUNTER — Other Ambulatory Visit: Payer: Self-pay

## 2021-04-25 DIAGNOSIS — R0602 Shortness of breath: Secondary | ICD-10-CM

## 2021-04-25 DIAGNOSIS — E785 Hyperlipidemia, unspecified: Secondary | ICD-10-CM

## 2021-04-25 DIAGNOSIS — Z8249 Family history of ischemic heart disease and other diseases of the circulatory system: Secondary | ICD-10-CM

## 2021-04-25 LAB — ECHOCARDIOGRAM COMPLETE
Area-P 1/2: 3.65 cm2
S' Lateral: 2.6 cm

## 2021-07-25 DIAGNOSIS — Z139 Encounter for screening, unspecified: Secondary | ICD-10-CM | POA: Diagnosis not present

## 2021-07-25 DIAGNOSIS — Z6826 Body mass index (BMI) 26.0-26.9, adult: Secondary | ICD-10-CM | POA: Diagnosis not present

## 2021-07-25 DIAGNOSIS — Z0000 Encounter for general adult medical examination without abnormal findings: Secondary | ICD-10-CM | POA: Diagnosis not present

## 2021-08-09 ENCOUNTER — Ambulatory Visit: Payer: PPO | Admitting: Dermatology

## 2021-08-15 ENCOUNTER — Encounter: Payer: Self-pay | Admitting: Dermatology

## 2021-08-15 ENCOUNTER — Other Ambulatory Visit: Payer: Self-pay

## 2021-08-15 ENCOUNTER — Ambulatory Visit: Payer: PPO | Admitting: Dermatology

## 2021-08-15 DIAGNOSIS — L821 Other seborrheic keratosis: Secondary | ICD-10-CM | POA: Diagnosis not present

## 2021-08-15 DIAGNOSIS — C44511 Basal cell carcinoma of skin of breast: Secondary | ICD-10-CM

## 2021-08-15 DIAGNOSIS — C4491 Basal cell carcinoma of skin, unspecified: Secondary | ICD-10-CM

## 2021-08-15 DIAGNOSIS — Z1283 Encounter for screening for malignant neoplasm of skin: Secondary | ICD-10-CM

## 2021-08-15 DIAGNOSIS — L738 Other specified follicular disorders: Secondary | ICD-10-CM | POA: Diagnosis not present

## 2021-08-15 DIAGNOSIS — D485 Neoplasm of uncertain behavior of skin: Secondary | ICD-10-CM

## 2021-08-15 HISTORY — DX: Basal cell carcinoma of skin, unspecified: C44.91

## 2021-08-15 NOTE — Patient Instructions (Signed)

## 2021-08-21 ENCOUNTER — Telehealth: Payer: Self-pay | Admitting: *Deleted

## 2021-08-21 NOTE — Telephone Encounter (Signed)
Left message for patient to call back for pathology results.

## 2021-08-21 NOTE — Telephone Encounter (Signed)
Pathology to patient-surgery appointment scheduled.  

## 2021-08-21 NOTE — Telephone Encounter (Signed)
Patient left message on office voice mail that she was returning call about path results.

## 2021-08-21 NOTE — Telephone Encounter (Signed)
-----   Message from Lavonna Monarch, MD sent at 08/17/2021  6:45 PM EST ----- Schedule surgery with Dr. Darene Lamer

## 2021-09-03 ENCOUNTER — Encounter: Payer: Self-pay | Admitting: Dermatology

## 2021-09-03 NOTE — Progress Notes (Signed)
   Follow-Up Visit   Subjective  Nicole Hall is a 65 y.o. female who presents for the following: Annual Exam (Patient here today with her husband for her yearly skin check no new concerns. ).  General skin examination Location:  Duration:  Quality:  Associated Signs/Symptoms: Modifying Factors:  Severity:  Timing: Context:   Objective  Well appearing patient in no apparent distress; mood and affect are within normal limits. Right Breast Pearly 6 mm flat thickening: Superficial BCC       Mid Back Full body skin exam.  No atypical pigmented spots.  1 possible superficial nonmelanoma skin cancer right collarbone will be biopsied.  Mid Back 3 to 6 mm brown flattopped textured papules  Mid Forehead Half dozen 2 mm delled flesh-colored dermal papules    A full examination was performed including scalp, head, eyes, ears, nose, lips, neck, chest, axillae, abdomen, back, buttocks, bilateral upper extremities, bilateral lower extremities, hands, feet, fingers, toes, fingernails, and toenails. All findings within normal limits unless otherwise noted below.  Areas beneath undergarments not fully examined   Assessment & Plan    Neoplasm of uncertain behavior of skin Right Breast  Skin / nail biopsy Type of biopsy: tangential   Informed consent: discussed and consent obtained   Timeout: patient name, date of birth, surgical site, and procedure verified   Anesthesia: the lesion was anesthetized in a standard fashion   Anesthetic:  1% lidocaine w/ epinephrine 1-100,000 local infiltration Instrument used: flexible razor blade   Hemostasis achieved with: ferric subsulfate   Outcome: patient tolerated procedure well   Post-procedure details: wound care instructions given    Specimen 1 - Surgical pathology Differential Diagnosis: scc vs bcc  Check Margins: No  Screening for malignant neoplasm of skin Mid Back  Yearly skin exams.  Encouraged to self examine with spouse  twice annually.  Continue ultraviolet protection.  Seborrheic keratosis Mid Back  Leave if stable  Sebaceous hyperplasia Mid Forehead  Patirnt Told of similar appearance of early BCC so return for biopsy if there is growth or bleeding.      I, Lavonna Monarch, MD, have reviewed all documentation for this visit.  The documentation on 09/03/21 for the exam, diagnosis, procedures, and orders are all accurate and complete.

## 2021-10-11 ENCOUNTER — Other Ambulatory Visit: Payer: Self-pay | Admitting: Obstetrics & Gynecology

## 2021-10-11 DIAGNOSIS — Z1231 Encounter for screening mammogram for malignant neoplasm of breast: Secondary | ICD-10-CM

## 2021-10-19 ENCOUNTER — Other Ambulatory Visit: Payer: Self-pay

## 2021-10-19 ENCOUNTER — Ambulatory Visit (INDEPENDENT_AMBULATORY_CARE_PROVIDER_SITE_OTHER): Payer: PPO | Admitting: Dermatology

## 2021-10-19 ENCOUNTER — Encounter: Payer: Self-pay | Admitting: Dermatology

## 2021-10-19 DIAGNOSIS — C44511 Basal cell carcinoma of skin of breast: Secondary | ICD-10-CM | POA: Diagnosis not present

## 2021-10-19 DIAGNOSIS — L570 Actinic keratosis: Secondary | ICD-10-CM

## 2021-10-19 DIAGNOSIS — C44611 Basal cell carcinoma of skin of unspecified upper limb, including shoulder: Secondary | ICD-10-CM

## 2021-10-19 NOTE — Patient Instructions (Signed)

## 2021-11-06 ENCOUNTER — Ambulatory Visit
Admission: RE | Admit: 2021-11-06 | Discharge: 2021-11-06 | Disposition: A | Discharge status: Home / Self Care | Payer: PPO | Source: Ambulatory Visit | Attending: Obstetrics & Gynecology | Admitting: Obstetrics & Gynecology

## 2021-11-06 DIAGNOSIS — Z1231 Encounter for screening mammogram for malignant neoplasm of breast: Secondary | ICD-10-CM | POA: Diagnosis not present

## 2021-11-11 ENCOUNTER — Encounter: Payer: Self-pay | Admitting: Dermatology

## 2021-11-11 NOTE — Progress Notes (Signed)
° °  Follow-Up Visit   Subjective  Nicole Hall is a 66 y.o. female who presents for the following: Procedure (Patient here today with her husband for treatment of BCC x 1 right breast).  Biopsy proven BCC right chest, crusty spot on face Location:  Duration:  Quality:  Associated Signs/Symptoms: Modifying Factors:  Severity:  Timing: Context:   Objective  Well appearing patient in no apparent distress; mood and affect are within normal limits. Right Breast Lesion identified by Dr.Trason Shifflet and nurse in room.    Left Temporal Scalp Gritty 5 mm pink crust    A focused examination was performed including head, neck, upper chest,. Relevant physical exam findings are noted in the Assessment and Plan.   Assessment & Plan    Basal cell carcinoma (BCC) of skin of upper extremity including shoulder, unspecified laterality Right Breast  Destruction of lesion Complexity: simple   Destruction method: electrodesiccation and curettage   Informed consent: discussed and consent obtained   Timeout:  patient name, date of birth, surgical site, and procedure verified Anesthesia: the lesion was anesthetized in a standard fashion   Anesthetic:  1% lidocaine w/ epinephrine 1-100,000 local infiltration Curettage performed in three different directions: Yes   Curettage cycles:  3 Lesion length (cm):  0.7 Lesion width (cm):  0.7 Margin per side (cm):  0 Final wound size (cm):  0.7 Hemostasis achieved with:  ferric subsulfate Outcome: patient tolerated procedure well with no complications   Additional details:  Wound innoculated with 5 fluorouracil solution.  AK (actinic keratosis) Left Temporal Scalp  Destruction of lesion - Left Temporal Scalp Complexity: simple   Destruction method: cryotherapy   Informed consent: discussed and consent obtained   Timeout:  patient name, date of birth, surgical site, and procedure verified Lesion destroyed using liquid nitrogen: Yes   Cryotherapy  cycles:  5 Outcome: patient tolerated procedure well with no complications   Post-procedure details: wound care instructions given        I, Lavonna Monarch, MD, have reviewed all documentation for this visit.  The documentation on 11/11/21 for the exam, diagnosis, procedures, and orders are all accurate and complete.

## 2021-11-22 ENCOUNTER — Ambulatory Visit: Payer: PPO | Admitting: Cardiology

## 2021-12-27 ENCOUNTER — Ambulatory Visit
Admission: RE | Admit: 2021-12-27 | Discharge: 2021-12-27 | Disposition: A | Discharge status: Home / Self Care | Payer: PPO | Source: Ambulatory Visit | Attending: Emergency Medicine | Admitting: Emergency Medicine

## 2021-12-27 ENCOUNTER — Other Ambulatory Visit: Payer: Self-pay

## 2021-12-27 VITALS — BP 157/80 | HR 76 | Temp 99.2°F | Resp 18

## 2021-12-27 DIAGNOSIS — J020 Streptococcal pharyngitis: Secondary | ICD-10-CM

## 2021-12-27 DIAGNOSIS — H9202 Otalgia, left ear: Secondary | ICD-10-CM

## 2021-12-27 DIAGNOSIS — Z20818 Contact with and (suspected) exposure to other bacterial communicable diseases: Secondary | ICD-10-CM

## 2021-12-27 LAB — POCT RAPID STREP A (OFFICE): Rapid Strep A Screen: POSITIVE — AB

## 2021-12-27 MED ORDER — PENICILLIN G BENZATHINE 1200000 UNIT/2ML IM SUSY
1.2000 10*6.[IU] | PREFILLED_SYRINGE | Freq: Once | INTRAMUSCULAR | Status: AC
  Administered 2021-12-27: 1.2 10*6.[IU] via INTRAMUSCULAR

## 2021-12-27 NOTE — ED Provider Notes (Signed)
?[facility] ? ? ? ?CSN: [id number] ?Arrival date & time: 12/27/21  7425 ?  ? ?HISTORY  ? ?Chief Complaint  ?Patient presents with  ? Chills  ? Sore Throat  ? Otalgia  ? ?HPI ?Nicole Hall is a 66 y.o. female. Pt states this past Saturday she began having chills, irritation to throat, swollen glands and pain to her left ear. She states she was exposed to a grandchild who had strep.  Patient endorses body ache and subjective fever as well.  Patient states has been taking ibuprofen for her pain which has provided little relief.  Patient states he is also had difficulty swallowing. ? ?The history is provided by the patient.  ?Past Medical History:  ?Diagnosis Date  ? Superficial basal cell carcinoma (BCC) 08/15/2021  ? Right Breast (curet and 5Fu)  ? ?There are no problems to display for this patient. ? ?Past Surgical History:  ?Procedure Laterality Date  ? KNEE SURGERY Left   ? ?OB History   ? ? Gravida  ?4  ? Para  ?3  ? Term  ?   ? Preterm  ?   ? AB  ?1  ? Living  ?3  ?  ? ? SAB  ?1  ? IAB  ?   ? Ectopic  ?   ? Multiple  ?   ? Live Births  ?   ?   ?  ?  ? ?Home Medications   ? ?Prior to Admission medications   ?Medication Sig Start Date End Date Taking? Authorizing Provider  ?atorvastatin (LIPITOR) 40 MG tablet Take 1 tablet by mouth daily.    [provider]  ?Cholecalciferol (VITAMIN D) 50 MCG (2000 UT) CAPS Take 1 capsule by mouth daily.    [provider]  ? ?Family History ?Family History  ?Adopted: Yes  ? ?Social History ?Social History  ? ?Tobacco Use  ? Smoking status: Never  ? Smokeless tobacco: Never  ?Vaping Use  ? Vaping Use: Never used  ?Substance Use Topics  ? Alcohol use: Yes  ?  Comment: WINE  ? Drug use: No  ? ?Allergies   ?Patient has no known allergies. ? ?Review of Systems ?Review of Systems ?Pertinent findings noted in history of present illness.  ? ?Physical Exam ?Triage Vital Signs ?ED Triage Vitals  ?Enc Vitals Group  ?   BP 08/04/21 0827 (!) 147/82  ?   Pulse  Rate 08/04/21 0827 72  ?   Resp 08/04/21 0827 18  ?   Temp 08/04/21 0827 98.3 ?F (36.8 ?C)  ?   Temp Source 08/04/21 0827 Oral  ?   SpO2 08/04/21 0827 98 %  ?   Weight --   ?   Height --   ?   Head Circumference --   ?   Peak Flow --   ?   Pain Score 08/04/21 0826 5  ?   Pain Loc --   ?   Pain Edu? --   ?   Excl. in [place]? --   ?No data found. ? ?Updated Vital Signs ?BP (!) 157/80 (BP Location: Right Arm)   Pulse 76   Temp 99.2 ?F (37.3 ?C) (Oral)   Resp 18   SpO2 96%  ? ?Physical Exam ?Constitutional:   ?   General: She is not in acute distress. ?   Appearance: She is well-developed. She is ill-appearing. She is not toxic-appearing.  ?HENT:  ?   Head: Normocephalic and atraumatic.  ?  Salivary Glands: Right salivary gland is diffusely enlarged and tender. Left salivary gland is diffusely enlarged and tender.  ?   Right Ear: Hearing and external ear normal.  ?   Left Ear: Hearing and external ear normal.  ?   Ears:  ?   Comments: Right EAC mild erythema, left EAC is violaceous Lee erythematous with edema, right TM is normal, left TM is retracted ?   Nose: No mucosal edema, congestion or rhinorrhea.  ?   Right Turbinates: Not enlarged, swollen or pale.  ?   Left Turbinates: Not enlarged or swollen.  ?   Right Sinus: No maxillary sinus tenderness or frontal sinus tenderness.  ?   Left Sinus: No maxillary sinus tenderness or frontal sinus tenderness.  ?   Mouth/Throat:  ?   Lips: Pink. No lesions.  ?   Mouth: Mucous membranes are moist. No oral lesions or angioedema.  ?   Dentition: No gingival swelling.  ?   Tongue: No lesions.  ?   Palate: No mass.  ?   Pharynx: Uvula midline. Pharyngeal swelling, oropharyngeal exudate and posterior oropharyngeal erythema present. No uvula swelling.  ?   Tonsils: Tonsillar exudate present. 2+ on the right. 2+ on the left.  ?Eyes:  ?   Extraocular Movements: Extraocular movements intact.  ?   Conjunctiva/sclera: Conjunctivae normal.  ?   Pupils: Pupils are equal, round, and reactive  to light.  ?Neck:  ?   Thyroid: No thyroid mass, thyromegaly or thyroid tenderness.  ?   Trachea: Tracheal tenderness present. No abnormal tracheal secretions or tracheal deviation.  ?   Comments: Voice is muffled ?Cardiovascular:  ?   Rate and Rhythm: Normal rate and regular rhythm.  ?   Pulses: Normal pulses.  ?   Heart sounds: Normal heart sounds, S1 normal and S2 normal. No murmur heard. ?  No friction rub. No gallop.  ?Pulmonary:  ?   Effort: Pulmonary effort is normal. No accessory muscle usage, prolonged expiration, respiratory distress or retractions.  ?   Breath sounds: No stridor, decreased air movement or transmitted upper airway sounds. No decreased breath sounds, wheezing, rhonchi or rales.  ?Abdominal:  ?   General: Bowel sounds are normal.  ?   Palpations: Abdomen is soft.  ?   Tenderness: There is generalized abdominal tenderness. There is no right CVA tenderness, left CVA tenderness or rebound. Negative signs include Murphy's sign.  ?   Hernia: No hernia is present.  ?Musculoskeletal:     ?   General: No tenderness. Normal range of motion.  ?   Cervical back: Full passive range of motion without pain, normal range of motion and neck supple.  ?   Right lower leg: No edema.  ?   Left lower leg: No edema.  ?Lymphadenopathy:  ?   Cervical: Cervical adenopathy present.  ?   Right cervical: Superficial cervical adenopathy present.  ?   Left cervical: Superficial cervical adenopathy present.  ?Skin: ?   General: Skin is warm and dry.  ?   Findings: No erythema, lesion or rash.  ?Neurological:  ?   General: No focal deficit present.  ?   Mental Status: She is alert and oriented to person, place, and time. Mental status is at baseline.  ?Psychiatric:     ?   Mood and Affect: Mood normal.     ?   Behavior: Behavior normal.     ?   Thought Content: Thought content normal.     ?  Judgment: Judgment normal.  ? ? ?Visual Acuity ?Right Eye Distance:   ?Left Eye Distance:   ?Bilateral Distance:   ? ?Right Eye  Near:   ?Left Eye Near:    ?Bilateral Near:    ? ?UC Couse / Diagnostics / Procedures:  ?  ?EKG ? ?Radiology ?No results found. ? ?Procedures ?Procedures (including critical care time) ? ?UC Diagnoses / Final Clinical Impressions(s)   ?I have reviewed the triage vital signs and the nursing notes. ? ?Pertinent labs & imaging results that were available during my care of the patient were reviewed by me and considered in my medical decision making (see chart for details).   ?Final diagnoses:  ?Exposure to Streptococcal pharyngitis  ?Streptococcal pharyngitis  ?Acute otalgia, left  ? ?Rapid strep test is positive.  Patient does not have any signs of infection in her left ear however her left ear canal is diffusely erythematous and the TM is retracted.  Patient provided with a Bicillin injection which should rapidly relieve her symptoms.  Patient advised that she can continue ibuprofen as needed and that after 24 hours she is no longer considered contagious.  Return precautions advised. ?ED Prescriptions   ?None ?  ? ?PDMP not reviewed this encounter. ? ?Pending results:  ?Labs Reviewed  ?POCT RAPID STREP A (OFFICE) - Abnormal; Notable for the following components:  ?    Result Value  ? Rapid Strep A Screen Positive (*)   ? All other components within normal limits  ? ? ?Medications Ordered in UC: ?Medications  ?penicillin g benzathine (BICILLIN LA) 1200000 UNIT/2ML injection 1.2 Million Units (1.2 Million Units Intramuscular Given 12/27/21 1142)  ? ? ?Disposition Upon Discharge:  ?Condition: stable for discharge home ?Home: take medications as prescribed; routine discharge instructions as discussed; follow up as advised. ? ?Patient presented with an acute illness with associated systemic symptoms and significant discomfort requiring urgent management. In my opinion, this is a condition that a prudent lay person (someone who possesses an average knowledge of health and medicine) may potentially expect to result in  complications if not addressed urgently such as respiratory distress, impairment of bodily function or dysfunction of bodily organs.  ? ?Routine symptom specific, illness specific and/or disease specific instruction

## 2021-12-27 NOTE — Discharge Instructions (Signed)
Your rapid strep test today is positive.  You received an injection of penicillin G benzathine, also known as Bicillin LA, to treat you for this.  No further antibiotics are needed.  After  24 hours, you no longer be considered contagious and should begin to feel much better.   ? ?For relief of your symptoms, please consider using the following: ? ?Ibuprofen  (Advil, Motrin): This is a good anti-inflammatory medication which addresses aches, pains and inflammation of the upper airways that causes sinus and nasal congestion as well as in the lower airways which makes your cough feel tight and sometimes burn.  I recommend that you take between 400 every 6-8 hours as needed.    ?  ?Chloraseptic Throat Spray: Spray 5 sprays into affected area every 2 hours, hold for 15 seconds and either swallow or spit it out.  This is a excellent numbing medication because it is a spray, you can put it right where you needed and so sucking on a lozenge and numbing your entire mouth.    ?  ?Please remain home from work, school, public places for the next 24 hours. ? ?Please follow-up within the next 7-10 days either with your primary care provider or urgent care if your symptoms do not resolve.  If you do not have a primary care provider, we will assist you in finding one. ?  ?Thank you for visiting urgent care today.  We appreciate the opportunity to participate in your care. ? ?

## 2021-12-27 NOTE — ED Triage Notes (Signed)
Pt states this past Saturday she began having chills, irritation to throat, swollen glands and pain to bilateral ears. She states she was exposed to strep.  ?

## 2021-12-30 DIAGNOSIS — Z6826 Body mass index (BMI) 26.0-26.9, adult: Secondary | ICD-10-CM | POA: Diagnosis not present

## 2021-12-30 DIAGNOSIS — J028 Acute pharyngitis due to other specified organisms: Secondary | ICD-10-CM | POA: Diagnosis not present

## 2021-12-30 DIAGNOSIS — E785 Hyperlipidemia, unspecified: Secondary | ICD-10-CM | POA: Diagnosis not present

## 2021-12-30 DIAGNOSIS — K828 Other specified diseases of gallbladder: Secondary | ICD-10-CM | POA: Diagnosis not present

## 2021-12-30 DIAGNOSIS — E663 Overweight: Secondary | ICD-10-CM | POA: Diagnosis not present

## 2021-12-30 DIAGNOSIS — R0600 Dyspnea, unspecified: Secondary | ICD-10-CM | POA: Diagnosis not present

## 2021-12-30 DIAGNOSIS — H9193 Unspecified hearing loss, bilateral: Secondary | ICD-10-CM | POA: Diagnosis not present

## 2022-01-03 DIAGNOSIS — H43813 Vitreous degeneration, bilateral: Secondary | ICD-10-CM | POA: Diagnosis not present

## 2022-01-04 ENCOUNTER — Encounter: Payer: Self-pay | Admitting: Obstetrics & Gynecology

## 2022-01-04 ENCOUNTER — Ambulatory Visit (INDEPENDENT_AMBULATORY_CARE_PROVIDER_SITE_OTHER): Payer: PPO | Admitting: Obstetrics & Gynecology

## 2022-01-04 VITALS — BP 124/80 | HR 76 | Resp 16 | Ht 64.25 in | Wt 157.0 lb

## 2022-01-04 DIAGNOSIS — Z01419 Encounter for gynecological examination (general) (routine) without abnormal findings: Secondary | ICD-10-CM

## 2022-01-04 DIAGNOSIS — M8588 Other specified disorders of bone density and structure, other site: Secondary | ICD-10-CM

## 2022-01-04 DIAGNOSIS — Z780 Asymptomatic menopausal state: Secondary | ICD-10-CM

## 2022-01-04 DIAGNOSIS — B3731 Acute candidiasis of vulva and vagina: Secondary | ICD-10-CM

## 2022-01-04 MED ORDER — FLUCONAZOLE 150 MG PO TABS: 150.0000 mg | ORAL_TABLET | Freq: Every day | ORAL | 2 refills | Status: AC

## 2022-01-04 NOTE — Progress Notes (Signed)
? ? ?Nicole Hall Jan 10, 1956 [id number] ? ? ?History:    66 y.o. [id number] Married.  3 Sons, GM of 5.  Adopted, found her biologic family through 44 and me!  :-) ?  ?RP:  Established patient presenting for annual gyn exam  ?  ?HPI: Postmenopause, well on no hormone replacement therapy.  No postmenopausal bleeding.  No pelvic pain.  No pain with intercourse. Pap Neg 12/2019. Normal vaginal secretions.  Breasts normal. Mammo Neg 10/2021. Urine and bowel movements normal.  Body mass index 26.74.  Very active, exercising regularly.  BD Osteopenia of AP Spine 01/2020.  Repeat BD here in 01/2022. Healthy nutrition.  Health labs with family physician. Colono 12/2020. ? ? ?Past medical history,surgical history, family history and social history were all reviewed and documented in the EPIC chart. ? ?Gynecologic History ?No LMP recorded. Patient is postmenopausal. ? ?Obstetric History ?OB History  ?Gravida Para Term Preterm AB Living  ?'4 3     1 3  '$ ?SAB IAB Ectopic Multiple Live Births  ?1          ?  ?# Outcome Date GA Lbr Len/2nd Weight Sex Delivery Anes PTL Lv  ?4 SAB           ?3 Para           ?2 Para           ?1 Para           ? ? ? ?ROS: A ROS was performed and pertinent positives and negatives are included in the history. ? GENERAL: No fevers or chills. HEENT: No change in vision, no earache, sore throat or sinus congestion. NECK: No pain or stiffness. CARDIOVASCULAR: No chest pain or pressure. No palpitations. PULMONARY: No shortness of breath, cough or wheeze. GASTROINTESTINAL: No abdominal pain, nausea, vomiting or diarrhea, melena or bright red blood per rectum. GENITOURINARY: No urinary frequency, urgency, hesitancy or dysuria. MUSCULOSKELETAL: No joint or muscle pain, no back pain, no recent trauma. DERMATOLOGIC: No rash, no itching, no lesions. ENDOCRINE: No polyuria, polydipsia, no heat or cold intolerance. No recent change in weight. HEMATOLOGICAL: No anemia or easy bruising or bleeding. NEUROLOGIC: No headache,  seizures, numbness, tingling or weakness. PSYCHIATRIC: No depression, no loss of interest in normal activity or change in sleep pattern.  ?  ? ?Exam: ? ? ?BP 124/80   Pulse 76   Resp 16   Ht 5' 4.25" (1.632 m)   Wt 157 lb (71.2 kg)   BMI 26.74 kg/m?  ? ?Body mass index is 26.74 kg/m?. ? ?General appearance : Well developed well nourished female. No acute distress ?HEENT: Eyes: no retinal hemorrhage or exudates,  Neck supple, trachea midline, no carotid bruits, no thyroidmegaly ?Lungs: Clear to auscultation, no rhonchi or wheezes, or rib retractions  ?Heart: Regular rate and rhythm, no murmurs or gallops ?Breast:Examined in sitting and supine position were symmetrical in appearance, no palpable masses or tenderness,  no skin retraction, no nipple inversion, no nipple discharge, no skin discoloration, no axillary or supraclavicular lymphadenopathy ?Abdomen: no palpable masses or tenderness, no rebound or guarding ?Extremities: no edema or skin discoloration or tenderness ? ?Pelvic: Vulva: Normal ?            Vagina: No gross lesions or discharge ? Cervix: No gross lesions or discharge ? Uterus  AV, normal size, shape and consistency, non-tender and mobile ? Adnexa  Without masses or tenderness ? Anus: Normal ? ? ?Assessment/Plan:  66 y.o. female for  annual exam  ? ?1. Well female exam with routine gynecological exam ?Postmenopause, well on no hormone replacement therapy.  No postmenopausal bleeding.  No pelvic pain.  No pain with intercourse. Pap Neg 12/2019. Normal vaginal secretions.  Breasts normal. Mammo Neg 10/2021. Urine and bowel movements normal.  Body mass index 26.74.  Very active, exercising regularly.  BD Osteopenia of AP Spine 01/2020.  Repeat BD here in 01/2022. Healthy nutrition.  Health labs with family physician. Colono 12/2020. ? ?2. Postmenopausal ?Postmenopause, well on no hormone replacement therapy.  No postmenopausal bleeding.  No pelvic pain.  No pain with intercours ? ?3. Osteopenia of lumbar  spine ?Body mass index 26.74.  Very active, exercising regularly.  BD Osteopenia of AP Spine 01/2020.  Repeat BD here in 01/2022. Vit D, Ca++ 1.5 g/d total. ?- DG Bone Density; Future ? ?4. Candidiasis of vulva ? ?Other orders ?- fluconazole (DIFLUCAN) 150 MG tablet; Take 1 tablet (150 mg total) by mouth daily for 3 days.  ? ?Princess Bruins MD, 1:46 PM 01/04/2022 ? ?  ?

## 2022-01-05 ENCOUNTER — Encounter: Payer: Self-pay | Admitting: Obstetrics & Gynecology

## 2022-01-09 DIAGNOSIS — E663 Overweight: Secondary | ICD-10-CM | POA: Diagnosis not present

## 2022-01-09 DIAGNOSIS — H6692 Otitis media, unspecified, left ear: Secondary | ICD-10-CM | POA: Diagnosis not present

## 2022-01-09 DIAGNOSIS — E785 Hyperlipidemia, unspecified: Secondary | ICD-10-CM | POA: Diagnosis not present

## 2022-01-09 DIAGNOSIS — K828 Other specified diseases of gallbladder: Secondary | ICD-10-CM | POA: Diagnosis not present

## 2022-01-09 DIAGNOSIS — Z6826 Body mass index (BMI) 26.0-26.9, adult: Secondary | ICD-10-CM | POA: Diagnosis not present

## 2022-01-09 DIAGNOSIS — J309 Allergic rhinitis, unspecified: Secondary | ICD-10-CM | POA: Diagnosis not present

## 2022-01-09 DIAGNOSIS — R0600 Dyspnea, unspecified: Secondary | ICD-10-CM | POA: Diagnosis not present

## 2022-01-09 DIAGNOSIS — H9193 Unspecified hearing loss, bilateral: Secondary | ICD-10-CM | POA: Diagnosis not present

## 2022-01-26 NOTE — Progress Notes (Deleted)
[facility] ? ?Cardiology Office Note:   ? ?Date:  01/26/2022  ? ?ID:  Nicole Hall, DOB 11/28/1955, MRN [id number] ? ?PCP:  Cher Nakai, MD ?  ?[facility] Providers ?Cardiologist:  None {   ? ?Referring MD: Cher Nakai, MD  ? ? ?History of Present Illness:   ? ?Nicole Hall is a 66 y.o. female with a hx of HLD and arthritis who presents to clinic for follow-up. ? ?Was initially seen in 03/2021 where she was having DOE with inclines and stairs. Ca score 04/2021 0. TTE 04/2021 with normal BiV function (EF 65-70%), no significant valve disease. ? ?Today, *** ? ?Past Medical History:  ?Diagnosis Date  ? Superficial basal cell carcinoma (BCC) 08/15/2021  ? Right Breast (curet and 5Fu)  ? ? ?Past Surgical History:  ?Procedure Laterality Date  ? KNEE SURGERY Left   ? ? ?Current Medications: ?No outpatient medications have been marked as taking for the 01/30/22 encounter (Appointment) with Freada Bergeron, MD.  ?  ? ?Allergies:   Patient has no known allergies.  ? ?Social History  ? ?Socioeconomic History  ? Marital status: Married  ?  Spouse name: Not on file  ? Number of children: Not on file  ? Years of education: Not on file  ? Highest education level: Not on file  ?Occupational History  ? Not on file  ?Tobacco Use  ? Smoking status: Never  ? Smokeless tobacco: Never  ?Vaping Use  ? Vaping Use: Never used  ?Substance and Sexual Activity  ? Alcohol use: Yes  ?  Comment: WINE occ, but not much lately  ? Drug use: No  ? Sexual activity: Yes  ?  Partners: Male  ?  Birth control/protection: Surgical  ?  Comment: 1ST INTERCOURSE- 52 , PARTNERS- 3  ?Other Topics Concern  ? Not on file  ?Social History Narrative  ? Not on file  ? ?Social Determinants of Health  ? ?Financial Resource Strain: Not on file  ?Food Insecurity: Not on file  ?Transportation Needs: Not on file  ?Physical Activity: Not on file  ?Stress: Not on file  ?Social Connections: Not on file  ?  ? ?Family  History: ?The patient's family history is not on file. She was adopted. ? ?ROS:   ?Please see the history of present illness.    ?Review of Systems  ?Constitutional:  Negative for chills and fever.  ?HENT:  Negative for hearing loss.   ?Eyes:  Negative for blurred vision.  ?Respiratory:  Positive for shortness of breath.   ?Cardiovascular:  Positive for leg swelling. Negative for chest pain, palpitations, orthopnea, claudication and PND.  ?Gastrointestinal:  Negative for nausea and vomiting.  ?Genitourinary:  Negative for hematuria.  ?Musculoskeletal:  Negative for falls.  ?Neurological:  Negative for dizziness and loss of consciousness.  ?Psychiatric/Behavioral:  Negative for substance abuse.    ? ?EKGs/Labs/Other Studies Reviewed:   ? ?The following studies were reviewed today: ?TTE 04/2021: ?IMPRESSIONS  ? ? ? 1. Left ventricular ejection fraction, by estimation, is 65 to 70%. The  ?left ventricle has normal function. The left ventricle has no regional  ?wall motion abnormalities. There is mild left ventricular hypertrophy.  ?Left ventricular diastolic parameters  ?were normal. The average left ventricular global longitudinal strain is  ?-20.9 %.  ? 2. Right ventricular systolic function is normal. The right ventricular  ?size is normal. There is normal pulmonary artery systolic pressure. The  ?estimated  right ventricular systolic pressure is 91.5 mmHg.  ? 3. The mitral valve is normal in structure. Trivial mitral valve  ?regurgitation.  ? 4. The aortic valve was not well visualized. Aortic valve regurgitation  ?is not visualized. No aortic stenosis is present.  ? 5. The inferior vena cava is normal in size with greater than 50%  ?respiratory variability, suggesting right atrial pressure of 3 mmHg.  ?Ca score 04/2021: ?FINDINGS: ?Coronary arteries: Normal origins. ?  ?Coronary Calcium Score: ?  ?Left main: 0 ?  ?Left anterior descending artery: 0 ?  ?Left circumflex artery: 0 ?  ?Right coronary artery: 0 ?   ?Total: 0 ?  ?Percentile: 0 ?  ?Pericardium: Normal. ?  ?Ascending Aorta: Normal caliber. Scattered calcifications in the ?ascending and descending aorta. ?  ?Non-cardiac: See separate report from [facility]. ?  ?IMPRESSION: ?Coronary calcium score of 0. This was 0 percentile for age-, race-, ?and sex-matched controls. ? ?EKG:  EKG is  ordered today.  The ekg ordered today demonstrates NSR with HR 65 ? ?Recent Labs: ?No results found for requested labs within last 8760 hours.  ?Recent Lipid Panel ?No results found for: CHOL, TRIG, HDL, CHOLHDL, VLDL, LDLCALC, LDLDIRECT ? ? ?    ? ?Physical Exam:   ? ?VS:  There were no vitals taken for this visit.   ? ?Wt Readings from Last 3 Encounters:  ?01/04/22 157 lb (71.2 kg)  ?04/05/21 155 lb 3.2 oz (70.4 kg)  ?01/03/21 155 lb (70.3 kg)  ?  ? ?GEN:  Well nourished, well developed in no acute distress ?HEENT: Normal ?NECK: No JVD; No carotid bruits ?CARDIAC: RRR, no murmurs, rubs, gallops ?RESPIRATORY:  Clear to auscultation without rales, wheezing or rhonchi  ?ABDOMEN: Soft, non-tender, non-distended ?MUSCULOSKELETAL:  No edema; No deformity  ?SKIN: Warm and dry ?NEUROLOGIC:  Alert and oriented x 3 ?PSYCHIATRIC:  Normal affect  ? ?ASSESSMENT:   ? ?No diagnosis found. ? ?PLAN:   ? ?In order of problems listed above: ? ?#Dyspnea on Exertion: ?Chronic and ongoing issue for years. Occurs with walking stairs and walking up an incline. Cardiac work-up reassuring. TTE with normal BiV function and no valve disease. Ca score 0.  ? ?#HLD: ?#Aortic atherosclerosis ?Ca score 0. ?-Continue lipitor '40mg'$  daily ? ? ? ?Medication Adjustments/Labs and Tests Ordered: ?Current medicines are reviewed at length with the patient today.  Concerns regarding medicines are outlined above.  ?No orders of the defined types were placed in this encounter. ? ? ?No orders of the defined types were placed in this encounter. ? ? ?There are no Patient Instructions on file for this visit. ?   ? ?Signed, ?Freada Bergeron, MD  ?01/26/2022 3:09 PM     ?

## 2022-01-30 ENCOUNTER — Ambulatory Visit: Payer: PPO | Admitting: Cardiology

## 2022-01-30 ENCOUNTER — Encounter: Payer: Self-pay | Admitting: Cardiology

## 2022-01-30 VITALS — BP 146/72 | HR 73 | Ht 64.25 in | Wt 156.6 lb

## 2022-01-30 DIAGNOSIS — Z8249 Family history of ischemic heart disease and other diseases of the circulatory system: Secondary | ICD-10-CM | POA: Diagnosis not present

## 2022-01-30 DIAGNOSIS — E785 Hyperlipidemia, unspecified: Secondary | ICD-10-CM

## 2022-01-30 DIAGNOSIS — R252 Cramp and spasm: Secondary | ICD-10-CM

## 2022-01-30 DIAGNOSIS — R0602 Shortness of breath: Secondary | ICD-10-CM

## 2022-01-30 MED ORDER — ROSUVASTATIN CALCIUM 20 MG PO TABS: 20.0000 mg | ORAL_TABLET | Freq: Every day | ORAL | 3 refills | Status: DC

## 2022-01-30 NOTE — Patient Instructions (Signed)
Medication Instructions:  ? ?STOP TAKING ATORVASTATIN (LIPITOR) NOW ? ?START TAKING ROSUVASTATIN (CRESTOR) 20 MG BY MOUTH DAILY ? ?*If you need a refill on your cardiac medications before your next appointment, please call your pharmacy* ? ? ?Follow-Up: ?At [facility], you and your health needs are our priority.  As part of our continuing mission to provide you with exceptional heart care, we have created designated Provider Care Teams.  These Care Teams include your primary Cardiologist (physician) and Advanced Practice Providers (APPs -  Physician Assistants and Nurse Practitioners) who all work together to provide you with the care you need, when you need it. ? ?We recommend signing up for the patient portal called "MyChart".  Sign up information is provided on this After Visit Summary.  MyChart is used to connect with patients for Virtual Visits (Telemedicine).  Patients are able to view lab/test results, encounter notes, upcoming appointments, etc.  Non-urgent messages can be sent to your provider as well.   ?To learn more about what you can do with MyChart, go to [email or web address].   ? ?Your next appointment:   ?1 year(s) ? ?The format for your next appointment:   ?In Person ? ?Provider:   ?DR. PEMBERTON ? ?Important Information About Sugar ? ? ? ? ? ? ?

## 2022-01-30 NOTE — Progress Notes (Signed)
?Cardiology Office Note:   ? ?Date:  01/30/2022  ? ?ID:  Nicole Hall, DOB 1956-05-12, MRN [id number] ? ?PCP:  Cher Nakai, MD ?  ?[facility] Providers ?Cardiologist:  None {   ? ?Referring MD: Cher Nakai, MD  ? ? ?History of Present Illness:   ? ?Nicole Hall is a 66 y.o. female with a hx of HLD and arthritis who presents to clinic for follow-up. ? ?Was initially seen in 03/2021 where she was having DOE with inclines and stairs. Ca score 04/2021 0. TTE 04/2021 with normal BiV function (EF 65-70%), no significant valve disease. ? ?Today, she is accompanied by her husband and doing well. She has a known heart murmur from childhood.  ? ?She takes her statin every evening. 2 days ago, she started having bilateral leg pain that is worse with exertion. The pain improves with rest. When she touches her muscles, she feels soreness. She wonders if she can reduce her evening statin and drink grape fruit juice in the morning to control her cholesterol.  Of note, her legs will also cramp at night.  ? ?Her husband reports she becomes winded quickly walking up stairs or inclines. She mentions the shortness of breath is chronic. She denies chest pain, chest pressure, dyspnea at rest or with exertion, palpitations, PND, orthopnea, or leg swelling.  ? ?She endorses White Coat Syndrome. However, she reports her blood pressure at home is normal. ? ?Past Medical History:  ?Diagnosis Date  ? Superficial basal cell carcinoma (BCC) 08/15/2021  ? Right Breast (curet and 5Fu)  ? ? ?Past Surgical History:  ?Procedure Laterality Date  ? KNEE SURGERY Left   ? ? ?Current Medications: ?Current Meds  ?Medication Sig  ? Cholecalciferol (VITAMIN D) 50 MCG (2000 UT) CAPS Take 1 capsule by mouth daily.  ? rosuvastatin (CRESTOR) 20 MG tablet Take 1 tablet (20 mg total) by mouth daily.  ? [DISCONTINUED] atorvastatin (LIPITOR) 40 MG tablet Take 1 tablet by mouth daily.  ?  ? ?Allergies:   Patient has no known allergies.  ? ?Social History   ? ?Socioeconomic History  ? Marital status: Married  ?  Spouse name: Not on file  ? Number of children: Not on file  ? Years of education: Not on file  ? Highest education level: Not on file  ?Occupational History  ? Not on file  ?Tobacco Use  ? Smoking status: Never  ? Smokeless tobacco: Never  ?Vaping Use  ? Vaping Use: Never used  ?Substance and Sexual Activity  ? Alcohol use: Yes  ?  Comment: WINE occ, but not much lately  ? Drug use: No  ? Sexual activity: Yes  ?  Partners: Male  ?  Birth control/protection: Surgical  ?  Comment: 1ST INTERCOURSE- 51 , PARTNERS- 3  ?Other Topics Concern  ? Not on file  ?Social History Narrative  ? Not on file  ? ?Social Determinants of Health  ? ?Financial Resource Strain: Not on file  ?Food Insecurity: Not on file  ?Transportation Needs: Not on file  ?Physical Activity: Not on file  ?Stress: Not on file  ?Social Connections: Not on file  ?  ? ?Family History: ?The patient's family history is not on file. She was adopted. ? ?ROS:   ?Please see the history of present illness.    ?Review of Systems  ?Constitutional:  Negative for chills and fever.  ?HENT:  Negative for hearing loss.   ?Eyes:  Negative for blurred vision.  ?Respiratory:  Negative for shortness of breath.   ?Cardiovascular:  Negative for chest pain, palpitations, orthopnea, claudication, leg swelling and PND.  ?Gastrointestinal:  Negative for nausea and vomiting.  ?Genitourinary:  Negative for hematuria.  ?Musculoskeletal:  Positive for myalgias (bilateral LE). Negative for falls.  ?Neurological:  Positive for tingling (bilateral leg cramps). Negative for dizziness and loss of consciousness.  ?Endo/Heme/Allergies:  Negative for environmental allergies.  ?Psychiatric/Behavioral:  Negative for substance abuse.    ? ?EKGs/Labs/Other Studies Reviewed:   ? ?The following studies were reviewed today: ?TTE 04/2021: ?IMPRESSIONS  ? 1. Left ventricular ejection fraction, by estimation, is 65 to 70%. The  ?left ventricle has  normal function. The left ventricle has no regional  ?wall motion abnormalities. There is mild left ventricular hypertrophy.  ?Left ventricular diastolic parameters  ?were normal. The average left ventricular global longitudinal strain is  ?-20.9 %.  ? 2. Right ventricular systolic function is normal. The right ventricular  ?size is normal. There is normal pulmonary artery systolic pressure. The  ?estimated right ventricular systolic pressure is 35.3 mmHg.  ? 3. The mitral valve is normal in structure. Trivial mitral valve  ?regurgitation.  ? 4. The aortic valve was not well visualized. Aortic valve regurgitation  ?is not visualized. No aortic stenosis is present.  ? 5. The inferior vena cava is normal in size with greater than 50%  ?respiratory variability, suggesting right atrial pressure of 3 mmHg.  ? ?Ca score 04/2021: ?FINDINGS: ?Coronary arteries: Normal origins. ?  ?Coronary Calcium Score: ?  ?Left main: 0 ?  ?Left anterior descending artery: 0 ?  ?Left circumflex artery: 0 ?  ?Right coronary artery: 0 ?  ?Total: 0 ?  ?Percentile: 0 ?  ?Pericardium: Normal. ?  ?Ascending Aorta: Normal caliber. Scattered calcifications in the ?ascending and descending aorta. ?  ?Non-cardiac: See separate report from [facility]. ?  ?IMPRESSION: ?Coronary calcium score of 0. This was 0 percentile for age-, race-, ?and sex-matched controls. ? ?EKG:  EKG was not ordered today ?04/05/21: NSR with HR 65 ? ?Recent Labs: ?No results found for requested labs within last 8760 hours.  ?Recent Lipid Panel ?No results found for: CHOL, TRIG, HDL, CHOLHDL, VLDL, LDLCALC, LDLDIRECT ? ? ?    ? ?Physical Exam:   ? ?VS:  BP (!) 146/72   Pulse 73   Ht 5' 4.25" (1.632 m)   Wt 156 lb 9.6 oz (71 kg)   SpO2 98%   BMI 26.67 kg/m?    ? ?Wt Readings from Last 3 Encounters:  ?01/30/22 156 lb 9.6 oz (71 kg)  ?01/04/22 157 lb (71.2 kg)  ?04/05/21 155 lb 3.2 oz (70.4 kg)  ?  ? ?GEN:  Well nourished, well developed in no acute distress ?HEENT:  Normal ?NECK: No JVD; No carotid bruits ?CARDIAC: RRR, 1/6 systolic flow murmur, no rubs or gallops ?RESPIRATORY:  Clear to auscultation without rales, wheezing or rhonchi  ?ABDOMEN: Soft, non-tender, non-distended ?MUSCULOSKELETAL:  No edema; No deformity, diminished pulses DP/TP ?SKIN: Warm and dry ?NEUROLOGIC:  Alert and oriented x 3 ?PSYCHIATRIC:  Normal affect  ? ?ASSESSMENT:   ? ?1. Hyperlipidemia, unspecified hyperlipidemia type   ?2. Family history of early CAD   ?3. SOB (shortness of breath)   ?4. Cramping of feet   ? ? ?PLAN:   ? ?In order of problems listed above: ? ?#Dyspnea on Exertion: ?Chronic and ongoing issue for years. Occurs with walking stairs and walking up an incline. Cardiac work-up reassuring. TTE with normal BiV  function and no valve disease. Ca score 0.  ? ?#HLD: ?#Aortic atherosclerosis ?Ca score 0. Reports myalgias with her lipitor. Will trial crestor and monitor repsonse. ?-Change lipitor to crestor '20mg'$  daily ? ?#Calf pain/cramping: ?Suspect myalgias due to recent prolonged walking and statin use. Will change statin as above and if persist, can consider LE arterial dopplers. ?-Change statin as detailed above ?-If no change, can consider ABIs ? ?Medication Adjustments/Labs and Tests Ordered: ?Current medicines are reviewed at length with the patient today.  Concerns regarding medicines are outlined above.  ?No orders of the defined types were placed in this encounter. ? ? ?Meds ordered this encounter  ?Medications  ? rosuvastatin (CRESTOR) 20 MG tablet  ?  Sig: Take 1 tablet (20 mg total) by mouth daily.  ?  Dispense:  90 tablet  ?  Refill:  3  ?  D/C'ed lipitor and started on this  ? ? ?Patient Instructions  ?Medication Instructions:  ? ?STOP TAKING ATORVASTATIN (LIPITOR) NOW ? ?START TAKING ROSUVASTATIN (CRESTOR) 20 MG BY MOUTH DAILY ? ?*If you need a refill on your cardiac medications before your next appointment, please call your pharmacy* ? ? ?Follow-Up: ?At [facility], you and  your health needs are our priority.  As part of our continuing mission to provide you with exceptional heart care, we have created designated Provider Care Teams.  These Care Teams include your primary Cardiologi

## 2022-02-06 DIAGNOSIS — E785 Hyperlipidemia, unspecified: Secondary | ICD-10-CM | POA: Diagnosis not present

## 2022-02-06 DIAGNOSIS — Z9181 History of falling: Secondary | ICD-10-CM | POA: Diagnosis not present

## 2022-02-06 DIAGNOSIS — Z0000 Encounter for general adult medical examination without abnormal findings: Secondary | ICD-10-CM | POA: Diagnosis not present

## 2022-02-06 DIAGNOSIS — Z1331 Encounter for screening for depression: Secondary | ICD-10-CM | POA: Diagnosis not present

## 2022-02-16 ENCOUNTER — Other Ambulatory Visit (HOSPITAL_BASED_OUTPATIENT_CLINIC_OR_DEPARTMENT_OTHER): Payer: Self-pay

## 2022-02-16 MED ORDER — ZOSTER VAC RECOMB ADJUVANTED 50 MCG/0.5ML IM SUSR
INTRAMUSCULAR | 0 refills | Status: DC
  Filled 2022-02-16: qty 0.5, 1d supply, fill #0

## 2022-02-20 DIAGNOSIS — H6692 Otitis media, unspecified, left ear: Secondary | ICD-10-CM | POA: Diagnosis not present

## 2022-02-20 DIAGNOSIS — E663 Overweight: Secondary | ICD-10-CM | POA: Diagnosis not present

## 2022-02-20 DIAGNOSIS — Z6826 Body mass index (BMI) 26.0-26.9, adult: Secondary | ICD-10-CM | POA: Diagnosis not present

## 2022-02-20 DIAGNOSIS — E785 Hyperlipidemia, unspecified: Secondary | ICD-10-CM | POA: Diagnosis not present

## 2022-02-20 DIAGNOSIS — J309 Allergic rhinitis, unspecified: Secondary | ICD-10-CM | POA: Diagnosis not present

## 2022-02-20 DIAGNOSIS — H9193 Unspecified hearing loss, bilateral: Secondary | ICD-10-CM | POA: Diagnosis not present

## 2022-02-20 DIAGNOSIS — K828 Other specified diseases of gallbladder: Secondary | ICD-10-CM | POA: Diagnosis not present

## 2022-02-20 DIAGNOSIS — R0600 Dyspnea, unspecified: Secondary | ICD-10-CM | POA: Diagnosis not present

## 2022-03-20 ENCOUNTER — Ambulatory Visit (INDEPENDENT_AMBULATORY_CARE_PROVIDER_SITE_OTHER): Payer: PPO

## 2022-03-20 ENCOUNTER — Other Ambulatory Visit: Payer: Self-pay | Admitting: Obstetrics & Gynecology

## 2022-03-20 DIAGNOSIS — M8588 Other specified disorders of bone density and structure, other site: Secondary | ICD-10-CM

## 2022-03-20 DIAGNOSIS — Z13820 Encounter for screening for osteoporosis: Secondary | ICD-10-CM

## 2022-03-20 DIAGNOSIS — Z780 Asymptomatic menopausal state: Secondary | ICD-10-CM | POA: Diagnosis not present

## 2022-04-13 ENCOUNTER — Other Ambulatory Visit (HOSPITAL_BASED_OUTPATIENT_CLINIC_OR_DEPARTMENT_OTHER): Payer: Self-pay

## 2022-06-20 ENCOUNTER — Encounter (HOSPITAL_BASED_OUTPATIENT_CLINIC_OR_DEPARTMENT_OTHER): Payer: Self-pay | Admitting: Obstetrics and Gynecology

## 2022-06-20 ENCOUNTER — Other Ambulatory Visit: Payer: Self-pay

## 2022-06-20 ENCOUNTER — Emergency Department (HOSPITAL_BASED_OUTPATIENT_CLINIC_OR_DEPARTMENT_OTHER): Payer: PPO

## 2022-06-20 ENCOUNTER — Emergency Department (HOSPITAL_BASED_OUTPATIENT_CLINIC_OR_DEPARTMENT_OTHER): Payer: PPO | Admitting: Radiology

## 2022-06-20 ENCOUNTER — Observation Stay (HOSPITAL_BASED_OUTPATIENT_CLINIC_OR_DEPARTMENT_OTHER)
Admission: EM | Admit: 2022-06-20 | Discharge: 2022-06-21 | Disposition: A | Discharge status: Home / Self Care | Payer: PPO | Attending: Cardiovascular Disease | Admitting: Cardiovascular Disease

## 2022-06-20 ENCOUNTER — Encounter (HOSPITAL_COMMUNITY): Payer: Self-pay

## 2022-06-20 DIAGNOSIS — R778 Other specified abnormalities of plasma proteins: Secondary | ICD-10-CM | POA: Diagnosis not present

## 2022-06-20 DIAGNOSIS — I2489 Other forms of acute ischemic heart disease: Secondary | ICD-10-CM

## 2022-06-20 DIAGNOSIS — I10 Essential (primary) hypertension: Secondary | ICD-10-CM | POA: Diagnosis not present

## 2022-06-20 DIAGNOSIS — I248 Other forms of acute ischemic heart disease: Secondary | ICD-10-CM

## 2022-06-20 DIAGNOSIS — I161 Hypertensive emergency: Secondary | ICD-10-CM

## 2022-06-20 DIAGNOSIS — R072 Precordial pain: Secondary | ICD-10-CM | POA: Diagnosis not present

## 2022-06-20 DIAGNOSIS — Z20822 Contact with and (suspected) exposure to covid-19: Secondary | ICD-10-CM | POA: Insufficient documentation

## 2022-06-20 DIAGNOSIS — Z79899 Other long term (current) drug therapy: Secondary | ICD-10-CM | POA: Insufficient documentation

## 2022-06-20 DIAGNOSIS — R079 Chest pain, unspecified: Secondary | ICD-10-CM | POA: Diagnosis present

## 2022-06-20 DIAGNOSIS — I471 Supraventricular tachycardia: Secondary | ICD-10-CM

## 2022-06-20 DIAGNOSIS — I222 Subsequent non-ST elevation (NSTEMI) myocardial infarction: Secondary | ICD-10-CM | POA: Diagnosis not present

## 2022-06-20 DIAGNOSIS — Z85828 Personal history of other malignant neoplasm of skin: Secondary | ICD-10-CM | POA: Insufficient documentation

## 2022-06-20 DIAGNOSIS — R0789 Other chest pain: Secondary | ICD-10-CM | POA: Diagnosis not present

## 2022-06-20 DIAGNOSIS — R002 Palpitations: Secondary | ICD-10-CM

## 2022-06-20 DIAGNOSIS — I214 Non-ST elevation (NSTEMI) myocardial infarction: Secondary | ICD-10-CM | POA: Diagnosis present

## 2022-06-20 DIAGNOSIS — R000 Tachycardia, unspecified: Secondary | ICD-10-CM | POA: Insufficient documentation

## 2022-06-20 DIAGNOSIS — M7989 Other specified soft tissue disorders: Secondary | ICD-10-CM | POA: Diagnosis not present

## 2022-06-20 LAB — TROPONIN I (HIGH SENSITIVITY)
Troponin I (High Sensitivity): 29 ng/L — ABNORMAL HIGH (ref <18)
Troponin I (High Sensitivity): 82 ng/L — ABNORMAL HIGH (ref <18)
Troponin I (High Sensitivity): 61 ng/L — ABNORMAL HIGH (ref <18)

## 2022-06-20 LAB — CBC
HCT: 42.8 % (ref 36.0–46.0)
Hemoglobin: 14.1 g/dL (ref 12.0–15.0)
MCH: 30.6 pg (ref 26.0–34.0)
MCHC: 32.9 g/dL (ref 30.0–36.0)
MCV: 92.8 fL (ref 80.0–100.0)
Platelets: 287 10*3/uL (ref 150–400)
RBC: 4.61 MIL/uL (ref 3.87–5.11)
RDW: 12.7 % (ref 11.5–15.5)
WBC: 7.1 10*3/uL (ref 4.0–10.5)
nRBC: 0 % (ref 0.0–0.2)

## 2022-06-20 LAB — BASIC METABOLIC PANEL
Anion gap: 12 (ref 5–15)
BUN: 20 mg/dL (ref 8–23)
CO2: 27 mmol/L (ref 22–32)
Calcium: 9.4 mg/dL (ref 8.9–10.3)
Chloride: 102 mmol/L (ref 98–111)
Creatinine, Ser: 0.78 mg/dL (ref 0.44–1.00)
GFR, Estimated: >60 mL/min (ref >60)
Glucose, Bld: 147 mg/dL — ABNORMAL HIGH (ref 70–99)
Potassium: 3.7 mmol/L (ref 3.5–5.1)
Sodium: 141 mmol/L (ref 135–145)

## 2022-06-20 LAB — D-DIMER, QUANTITATIVE: D-Dimer, Quant: 0.36 ug/mL-FEU (ref 0.00–0.50)

## 2022-06-20 LAB — RESP PANEL BY RT-PCR (FLU A&B, COVID) ARPGX2
Influenza A by PCR: NEGATIVE
Influenza B by PCR: NEGATIVE
SARS Coronavirus 2 by RT PCR: NEGATIVE

## 2022-06-20 MED ORDER — ASPIRIN 325 MG PO TABS
325.0000 mg | ORAL_TABLET | Freq: Every day | ORAL | Status: DC
  Administered 2022-06-20: 325 mg via ORAL
  Filled 2022-06-20: qty 1

## 2022-06-20 MED ORDER — LABETALOL HCL 5 MG/ML IV SOLN: 20.0000 mg | Freq: Once | INTRAVENOUS | Status: DC

## 2022-06-20 MED ORDER — LABETALOL HCL 5 MG/ML IV SOLN
20.0000 mg | Freq: Once | INTRAVENOUS | Status: AC
  Administered 2022-06-20: 20 mg via INTRAVENOUS
  Filled 2022-06-20: qty 4

## 2022-06-20 NOTE — ED Triage Notes (Signed)
Patient reports to the ER for hypertension and palpitations feelings in the left side of her chest and she became concerned about her heart. Patient reports her BP at home was 168/100. Patient reports she is concerned it could be a panic attack as well but wanted to be checked out.

## 2022-06-20 NOTE — ED Provider Notes (Signed)
[facility] EMERGENCY DEPT Provider Note   CSN: [id number] Arrival date & time: 06/20/22  1008     History  Chief Complaint  Patient presents with   Hypertension   Palpitations         Nicole Hall is a 66 y.o. female.   Hypertension Associated symptoms include chest pain.  Palpitations Associated symptoms: chest pain      66 year old female with medical significant for basal cell carcinoma who presents to the emergency department with chest pain and palpitations.  The patient states that she experienced palpitations earlier today that lasted for about an hour.  She checked her Apple Watch and found her heart rate to be around 150.  Her home blood pressure was checked at home and it was hypertensive at 168/100.  She developed a pressure in her chest, described as a dull pain almost like she been struck in the chest.  She did not have any crushing substernal chest pain.  She denies any shortness of breath or cough.  No fevers or chills.  She states the pain has eased up some.  Her heart rate has also improved back to normal.  She still does have active pain.  Home Medications Prior to Admission medications   Medication Sig Start Date End Date Taking? Authorizing Provider  Cholecalciferol (VITAMIN D) 50 MCG (2000 UT) CAPS Take 1 capsule by mouth daily.   Yes [provider]  rosuvastatin (CRESTOR) 20 MG tablet Take 1 tablet (20 mg total) by mouth daily. 01/30/22  Yes Freada Bergeron, MD  Zoster Vaccine Adjuvanted [facility]) injection Inject into the muscle. 02/16/22         Allergies    Patient has no known allergies.    Review of Systems   Review of Systems  Cardiovascular:  Positive for chest pain and palpitations.  All other systems reviewed and are negative.   Physical Exam Updated Vital Signs BP (!) 155/72   Pulse 78   Temp 97.6 F (36.4 C)   Resp 18   Ht 5' 4.35" (1.634 m)   Wt 70.3 kg   SpO2 100%   BMI 26.32 kg/m  Physical  Exam Vitals and nursing note reviewed.  Constitutional:      General: She is not in acute distress.    Appearance: She is well-developed.  HENT:     Head: Normocephalic and atraumatic.  Eyes:     Conjunctiva/sclera: Conjunctivae normal.  Cardiovascular:     Rate and Rhythm: Normal rate and regular rhythm.     Pulses: Normal pulses.  Pulmonary:     Effort: Pulmonary effort is normal. No respiratory distress.     Breath sounds: Normal breath sounds.  Chest:     Comments: Mild chest wall tenderness to palpation Abdominal:     Palpations: Abdomen is soft.     Tenderness: There is no abdominal tenderness.  Musculoskeletal:        General: No swelling.     Cervical back: Neck supple.     Right lower leg: No edema.     Left lower leg: No edema.  Skin:    General: Skin is warm and dry.     Capillary Refill: Capillary refill takes less than 2 seconds.  Neurological:     Mental Status: She is alert.  Psychiatric:        Mood and Affect: Mood normal.     ED Results / Procedures / Treatments   Labs (all labs ordered are listed,  but only abnormal results are displayed) Labs Reviewed  BASIC METABOLIC PANEL - Abnormal; Notable for the following components:      Result Value   Glucose, Bld 147 (*)    All other components within normal limits  TROPONIN I (HIGH SENSITIVITY) - Abnormal; Notable for the following components:   Troponin I (High Sensitivity) 29 (*)    All other components within normal limits  TROPONIN I (HIGH SENSITIVITY) - Abnormal; Notable for the following components:   Troponin I (High Sensitivity) 82 (*)    All other components within normal limits  TROPONIN I (HIGH SENSITIVITY) - Abnormal; Notable for the following components:   Troponin I (High Sensitivity) 61 (*)    All other components within normal limits  RESP PANEL BY RT-PCR (FLU A&B, COVID) [id number]  CBC  D-DIMER, QUANTITATIVE    EKG EKG Interpretation  Date/Time:  Wednesday June 20 2022  10:20:44 EDT Ventricular Rate:  95 PR Interval:  186 QRS Duration: 84 QT Interval:  368 QTC Calculation: 462 R Axis:   78 Text Interpretation: Normal sinus rhythm Normal ECG No previous ECGs available Confirmed by Regan Lemming ([id number]) on 06/20/2022 11:16:49 AM  Radiology US Venous Img Lower Unilateral Right  Result Date: 06/20/2022 CLINICAL DATA:  Right lower extremity swelling. EXAM: Right LOWER EXTREMITY VENOUS DOPPLER ULTRASOUND TECHNIQUE: Gray-scale sonography with compression, as well as color and duplex ultrasound, were performed to evaluate the deep venous system(s) from the level of the common femoral vein through the popliteal and proximal calf veins. COMPARISON:  None Available. FINDINGS: VENOUS Normal compressibility of the common femoral, superficial femoral, and popliteal veins, as well as the visualized calf veins. Visualized portions of profunda femoral vein and great saphenous vein unremarkable. No filling defects to suggest DVT on grayscale or color Doppler imaging. Doppler waveforms show normal direction of venous flow, normal respiratory plasticity and response to augmentation. Limited views of the contralateral common femoral vein are unremarkable. OTHER None. Limitations: none IMPRESSION: Negative. Electronically Signed   By: Marijo Conception M.D.   On: 06/20/2022 14:35   DG Chest 2 View  Result Date: 06/20/2022 CLINICAL DATA:  Chest pain.  Hypertension with palpitations. EXAM: CHEST - 2 VIEW COMPARISON:  No prior chest radiographs available. Limited correlation made with cardiac CT 04/25/2021. FINDINGS: The heart size and mediastinal contours are normal. The lungs are clear. There is no pleural effusion or pneumothorax. No acute osseous findings are identified. IMPRESSION: No evidence of active cardiopulmonary process. Electronically Signed   By: Richardean Sale M.D.   On: 06/20/2022 10:38    Procedures Procedures    Medications Ordered in ED Medications  aspirin tablet  325 mg (325 mg Oral Given 06/20/22 1335)  labetalol (NORMODYNE) injection 20 mg (20 mg Intravenous Given 06/20/22 1242)    ED Course/ Medical Decision Making/ A&P Clinical Course as of 06/20/22 2125  Wed Jun 20, 2022  1333 Troponin I (High Sensitivity)(!): 82 [JL]  1346 D-Dimer, Quant: 0.36 [JL]    Clinical Course User Index [JL] Regan Lemming, MD                           Medical Decision Making Amount and/or Complexity of Data Reviewed Labs: ordered. Decision-making details documented in ED Course. Radiology: ordered.  Risk OTC drugs. Prescription drug management. Decision regarding hospitalization.     66 year old female with medical significant for basal cell carcinoma who presents to the emergency department with chest pain and  palpitations.  The patient states that she experienced palpitations earlier today that lasted for about an hour.  She checked her Apple Watch and found her heart rate to be around 150.  Her home blood pressure was checked at home and it was hypertensive at 168/100.  She developed a pressure in her chest, described as a dull pain almost like she been struck in the chest.  She did not have any crushing substernal chest pain.  She denies any shortness of breath or cough.  No fevers or chills.  She states the pain has eased up some.  Her heart rate has also improved back to normal.  She still does have active pain.  Additionally, she states that she has had chronic right lower extremity swelling that occasionally flares up towards the end of the day.  She denies any history of DVT or PE.  On arrival, the patient was afebrile, mildly tachycardic P102, RR 14, BP hypertensive 188/85, saturating 100% on room air.  Sinus rhythm noted on cardiac telemetry on my evaluation.  Physical exam significant for well-appearing female in no distress, some reproducible chest wall tenderness to palpation noted.  Differential diagnosis includes chest wall pain, hypertensive  emergency, ACS, anxiety, PE.  Initial EKG revealed sinus rhythm, ventricular rate 95, no acute ischemic changes noted.  Chest x-ray was performed which revealed no acute cardiac or pulmonary abnormality.  A D-dimer was collected and resulted negative.  DVT ultrasound was also negative for DVT.  COVID-19 influenza PCR testing resulted negative.  BMP revealed hyperglycemia 147 but was otherwise unremarkable.  A CBC was without a leukocytosis or anemia.  The patient's initial troponin resulted elevated to 29, repeat troponin was climbing to 82.  The patient was administered IV labetalol 20 mg with subsequent improvement in her blood pressure to 155/72.  Her third troponin was downtrending to 61.  I did speak with on-call cardiology who recommended admission to their service for chest pain work-up.  Given the downtrending troponin, favor most likely hypertensive emergency versus demand ischemia in the setting of an unspecified cardiac arrhythmia earlier today, potentially SVT given a rate of around 150.  Patient administered 325 mg of aspirin.  Overall well-appearing and stable on repeat assessment.  Dr. Angelena Form accepted the patient in admission.   Final Clinical Impression(s) / ED Diagnoses Final diagnoses:  Hypertensive emergency  Chest pain, unspecified type    Rx / DC Orders ED Discharge Orders     None         Regan Lemming, MD 06/20/22 2125

## 2022-06-20 NOTE — H&P (Signed)
Cardiology Admission History and Physical   Patient ID: Nicole Hall MRN: [id number]; DOB: [birth year]   Admission date: 06/20/2022  PCP:  Cher Nakai, MD   [facility] Cardiologist:  Gwyndolyn Kaufman, MD  Click here to update MD or APP on Care Team, Refresh:1}     Chief Complaint:  hypertension and palpitations in her left chest  Patient Profile:   Nicole Hall is a 66 y.o. female with HLD and arthritis who is being seen 06/20/2022 for the evaluation of palpitations.  History of Present Illness:   Ms. Nicole Hall is a 66 yo female with HLD, arthritis, basal cell carcinoma who presented to emergency department after an episode of chest pain and elevated heart rate. Noted the chest discomfort earlier and after she sat down her FitBit watch noted elevated HR. Home BP was 168/100. This prompted her to come in for evaluation. Chest pressure had subsided. She was given IV labetalol for heart rate. Now feels she is her usual state of health. Only notable change was recent significant stressor the loss of her best friend 3 days ago. No med or dietary changes.   ED workup included troponin 29 -> 82 -> 61   Past Medical History:  Diagnosis Date   Superficial basal cell carcinoma (BCC) 08/15/2021   Right Breast (curet and 5Fu)    Past Surgical History:  Procedure Laterality Date   KNEE SURGERY Left      Medications Prior to Admission: Prior to Admission medications   Medication Sig Start Date End Date Taking? Authorizing Provider  Cholecalciferol (VITAMIN D) 50 MCG (2000 UT) CAPS Take 1 capsule by mouth daily.   Yes [provider]  rosuvastatin (CRESTOR) 20 MG tablet Take 1 tablet (20 mg total) by mouth daily. 01/30/22  Yes Freada Bergeron, MD  Zoster Vaccine Adjuvanted [facility]) injection Inject into the muscle. 02/16/22        Allergies:   No Known Allergies  Social History:   Social History   Socioeconomic History   Marital status: Married     Spouse name: Not on file   Number of children: Not on file   Years of education: Not on file   Highest education level: Not on file  Occupational History   Not on file  Tobacco Use   Smoking status: Never    Passive exposure: Never   Smokeless tobacco: Never  Vaping Use   Vaping Use: Never used  Substance and Sexual Activity   Alcohol use: Yes    Alcohol/week: 7.0 standard drinks of alcohol    Types: 7 Glasses of wine per week   Drug use: No   Sexual activity: Yes    Partners: Male    Birth control/protection: Surgical    Comment: 1ST INTERCOURSE- 16 , PARTNERS- 3  Other Topics Concern   Not on file  Social History Narrative   Not on file   Social Determinants of Health   Financial Resource Strain: Not on file  Food Insecurity: Not on file  Transportation Needs: Not on file  Physical Activity: Not on file  Stress: Not on file  Social Connections: Not on file  Intimate Partner Violence: Not on file    Family History:   The patient's family history is not on file. She was adopted.    ROS:  Please see the history of present illness.  All other ROS reviewed and negative.     Physical Exam/Data:   Vitals:   06/20/22 1529  06/20/22 1900 06/20/22 2100 06/20/22 2350  BP:  (!) 170/79 (!) 155/72 (!) 161/72  Pulse:  76 78 64  Resp:  '19 18 17  '$ Temp: 98.7 F (37.1 C) (!) 96.6 F (35.9 C) 97.6 F (36.4 C) 98.8 F (37.1 C)  TempSrc: Oral   Oral  SpO2:  100% 100% 97%  Weight:      Height:       No intake or output data in the 24 hours ending 06/20/22 2357    06/20/2022   10:19 AM 01/30/2022    2:05 PM 01/04/2022    1:28 PM  Last 3 Weights  Weight (lbs) 155 lb 156 lb 9.6 oz 157 lb  Weight (kg) 70.308 kg 71.033 kg 71.215 kg     Body mass index is 26.32 kg/m.  General:   in no acute distress HEENT: normal Neck: no JVD Vascular: No carotid bruits; Distal pulses 2+ bilaterally   Cardiac:  normal S1, S2; RRR; no murmur  Lungs:  clear to auscultation bilaterally,  no wheezing, rhonchi or rales  Abd: soft, nontender, no hepatomegaly  Ext: no edema Musculoskeletal:  No deformities, BUE and BLE strength normal and equal Skin: warm and dry  Neuro:  CNs 2-12 intact, no focal abnormalities noted Psych:  Normal affect    EKG:  The ECG that was done  was personally reviewed and demonstrates normal sinus rhythm  Relevant CV Studies: 08/2021: normal echo  Laboratory Data:  High Sensitivity Troponin:   Recent Labs  Lab 06/20/22 1022 06/20/22 1226 06/20/22 1455  TROPONINIHS 29* 82* 61*      Chemistry Recent Labs  Lab 06/20/22 1022  NA 141  K 3.7  CL 102  CO2 27  GLUCOSE 147*  BUN 20  CREATININE 0.78  CALCIUM 9.4  GFRNONAA >60  ANIONGAP 12    No results for input(s): "PROT", "ALBUMIN", "AST", "ALT", "ALKPHOS", "BILITOT" in the last 168 hours. Lipids No results for input(s): "CHOL", "TRIG", "HDL", "LABVLDL", "LDLCALC", "CHOLHDL" in the last 168 hours. Hematology Recent Labs  Lab 06/20/22 1022  WBC 7.1  RBC 4.61  HGB 14.1  HCT 42.8  MCV 92.8  MCH 30.6  MCHC 32.9  RDW 12.7  PLT 287   Thyroid No results for input(s): "TSH", "FREET4" in the last 168 hours. BNPNo results for input(s): "BNP", "PROBNP" in the last 168 hours.  DDimer  Recent Labs  Lab 06/20/22 1022  DDIMER 0.36     Radiology/Studies:  US Venous Img Lower Unilateral Right  Result Date: 06/20/2022 CLINICAL DATA:  Right lower extremity swelling. EXAM: Right LOWER EXTREMITY VENOUS DOPPLER ULTRASOUND TECHNIQUE: Gray-scale sonography with compression, as well as color and duplex ultrasound, were performed to evaluate the deep venous system(s) from the level of the common femoral vein through the popliteal and proximal calf veins. COMPARISON:  None Available. FINDINGS: VENOUS Normal compressibility of the common femoral, superficial femoral, and popliteal veins, as well as the visualized calf veins. Visualized portions of profunda femoral vein and great saphenous vein  unremarkable. No filling defects to suggest DVT on grayscale or color Doppler imaging. Doppler waveforms show normal direction of venous flow, normal respiratory plasticity and response to augmentation. Limited views of the contralateral common femoral vein are unremarkable. OTHER None. Limitations: none IMPRESSION: Negative. Electronically Signed   By: Marijo Conception M.D.   On: 06/20/2022 14:35   DG Chest 2 View  Result Date: 06/20/2022 CLINICAL DATA:  Chest pain.  Hypertension with palpitations. EXAM: CHEST - 2  VIEW COMPARISON:  No prior chest radiographs available. Limited correlation made with cardiac CT 04/25/2021. FINDINGS: The heart size and mediastinal contours are normal. The lungs are clear. There is no pleural effusion or pneumothorax. No acute osseous findings are identified. IMPRESSION: No evidence of active cardiopulmonary process. Electronically Signed   By: Richardean Sale M.D.   On: 06/20/2022 10:38     Assessment and Plan:   NSTEMI, type 2 SVT She had recent echo that was normal and coronary score of 0. Appears to be low risk for CAD and more likely that this is secondary elevation in the setting of SVT. I have started her on low dose metoprolol for both BP and HR, which can be consolidated prior to discharge. She will likely need EP evaluation (I think can be done as outpatient) and possibly a 14 day monitor. Echo ordered to rule out any structural causes.   HLD - continue rosuvastatin 20 mg daily    Risk Assessment/Risk Scores:         Severity of Illness: The appropriate patient status for this patient is OBSERVATION. Observation status is judged to be reasonable and necessary in order to provide the required intensity of service to ensure the patient's safety. The patient's presenting symptoms, physical exam findings, and initial radiographic and laboratory data in the context of their medical condition is felt to place them at decreased risk for further clinical  deterioration. Furthermore, it is anticipated that the patient will be medically stable for discharge from the hospital within 2 midnights of admission.    For questions or updates, please contact [place] Please consult [email or web address] for contact info under     Signed, Doyne Keel, MD  06/20/2022 11:57 PM

## 2022-06-20 NOTE — ED Notes (Signed)
Pt provided chicken noodle soup and crackers per request. Dr. Armandina Gemma agreed.

## 2022-06-21 ENCOUNTER — Inpatient Hospital Stay (HOSPITAL_BASED_OUTPATIENT_CLINIC_OR_DEPARTMENT_OTHER)
Admit: 2022-06-21 | Discharge: 2022-06-21 | Disposition: A | Discharge status: Home / Self Care | Payer: PPO | Attending: Physician Assistant | Admitting: Physician Assistant

## 2022-06-21 ENCOUNTER — Observation Stay (HOSPITAL_BASED_OUTPATIENT_CLINIC_OR_DEPARTMENT_OTHER): Payer: PPO

## 2022-06-21 DIAGNOSIS — I214 Non-ST elevation (NSTEMI) myocardial infarction: Secondary | ICD-10-CM | POA: Diagnosis present

## 2022-06-21 DIAGNOSIS — I161 Hypertensive emergency: Principal | ICD-10-CM

## 2022-06-21 DIAGNOSIS — I248 Other forms of acute ischemic heart disease: Secondary | ICD-10-CM

## 2022-06-21 DIAGNOSIS — R0789 Other chest pain: Secondary | ICD-10-CM

## 2022-06-21 DIAGNOSIS — R002 Palpitations: Secondary | ICD-10-CM | POA: Diagnosis not present

## 2022-06-21 DIAGNOSIS — I2489 Other forms of acute ischemic heart disease: Secondary | ICD-10-CM

## 2022-06-21 LAB — COMPREHENSIVE METABOLIC PANEL
ALT: 19 U/L (ref 0–44)
AST: 22 U/L (ref 15–41)
Albumin: 3.7 g/dL (ref 3.5–5.0)
Alkaline Phosphatase: 58 U/L (ref 38–126)
Anion gap: 11 (ref 5–15)
BUN: 12 mg/dL (ref 8–23)
CO2: 24 mmol/L (ref 22–32)
Calcium: 9 mg/dL (ref 8.9–10.3)
Chloride: 105 mmol/L (ref 98–111)
Creatinine, Ser: 0.68 mg/dL (ref 0.44–1.00)
GFR, Estimated: >60 mL/min (ref >60)
Glucose, Bld: 137 mg/dL — ABNORMAL HIGH (ref 70–99)
Potassium: 3.5 mmol/L (ref 3.5–5.1)
Sodium: 140 mmol/L (ref 135–145)
Total Bilirubin: 0.8 mg/dL (ref 0.3–1.2)
Total Protein: 6.4 g/dL — ABNORMAL LOW (ref 6.5–8.1)

## 2022-06-21 LAB — TSH: TSH: 4.505 u[IU]/mL — ABNORMAL HIGH (ref 0.350–4.500)

## 2022-06-21 LAB — ECHOCARDIOGRAM COMPLETE
Area-P 1/2: 5.02 cm2
Calc EF: 63.6 %
Height: 64.35 in
S' Lateral: 2.3 cm
Single Plane A2C EF: 61.8 %
Single Plane A4C EF: 64.9 %
Weight: 2480 oz

## 2022-06-21 LAB — LIPID PANEL
Cholesterol: 157 mg/dL (ref 0–200)
HDL: 73 mg/dL (ref >40)
LDL Cholesterol: 64 mg/dL (ref 0–99)
Total CHOL/HDL Ratio: 2.2 RATIO
Triglycerides: 102 mg/dL (ref <150)
VLDL: 20 mg/dL (ref 0–40)

## 2022-06-21 LAB — CBC
HCT: 38.8 % (ref 36.0–46.0)
Hemoglobin: 12.9 g/dL (ref 12.0–15.0)
MCH: 30.6 pg (ref 26.0–34.0)
MCHC: 33.2 g/dL (ref 30.0–36.0)
MCV: 92.2 fL (ref 80.0–100.0)
Platelets: 243 10*3/uL (ref 150–400)
RBC: 4.21 MIL/uL (ref 3.87–5.11)
RDW: 12.7 % (ref 11.5–15.5)
WBC: 11 10*3/uL — ABNORMAL HIGH (ref 4.0–10.5)
nRBC: 0 % (ref 0.0–0.2)

## 2022-06-21 LAB — HEMOGLOBIN A1C
Hgb A1c MFr Bld: 6.2 % — ABNORMAL HIGH (ref 4.8–5.6)
Mean Plasma Glucose: 131.24 mg/dL

## 2022-06-21 LAB — T4, FREE: Free T4: 0.89 ng/dL (ref 0.61–1.12)

## 2022-06-21 MED ORDER — METOPROLOL TARTRATE 25 MG PO TABS
25.0000 mg | ORAL_TABLET | Freq: Two times a day (BID) | ORAL | Status: DC
  Administered 2022-06-21 (×2): 25 mg via ORAL
  Filled 2022-06-21 (×2): qty 1

## 2022-06-21 MED ORDER — METOPROLOL TARTRATE 25 MG PO TABS: 25.0000 mg | ORAL_TABLET | Freq: Two times a day (BID) | ORAL | 3 refills | Status: DC

## 2022-06-21 MED ORDER — ROSUVASTATIN CALCIUM 20 MG PO TABS
20.0000 mg | ORAL_TABLET | Freq: Every day | ORAL | Status: DC
  Administered 2022-06-21: 20 mg via ORAL
  Filled 2022-06-21: qty 1

## 2022-06-21 MED ORDER — NITROGLYCERIN 0.4 MG SL SUBL: 0.4000 mg | SUBLINGUAL_TABLET | SUBLINGUAL | Status: DC | PRN

## 2022-06-21 MED ORDER — ENOXAPARIN SODIUM 40 MG/0.4ML IJ SOSY
40.0000 mg | PREFILLED_SYRINGE | INTRAMUSCULAR | Status: DC
  Filled 2022-06-21: qty 0.4

## 2022-06-21 MED ORDER — ONDANSETRON HCL 4 MG/2ML IJ SOLN: 4.0000 mg | Freq: Four times a day (QID) | INTRAMUSCULAR | Status: DC | PRN

## 2022-06-21 MED ORDER — ACETAMINOPHEN 325 MG PO TABS: 650.0000 mg | ORAL_TABLET | ORAL | Status: DC | PRN

## 2022-06-21 MED ORDER — ASPIRIN 81 MG PO TBEC: 81.0000 mg | DELAYED_RELEASE_TABLET | Freq: Every day | ORAL | Status: DC

## 2022-06-21 NOTE — Progress Notes (Signed)
Rounding Note    Patient Name: Nicole Hall Date of Encounter: 06/21/2022  [place] Cardiologist: Freada Bergeron, MD  Subjective   No recurrent episode of chest pain.  Breathing stable.  Very anxious person at baseline.  Inpatient Medications    Scheduled Meds:  [START ON 06/22/2022] aspirin EC  81 mg Oral Daily   enoxaparin (LOVENOX) injection  40 mg Subcutaneous Q24H   metoprolol tartrate  25 mg Oral BID   rosuvastatin  20 mg Oral Daily   Continuous Infusions:  PRN Meds: acetaminophen, nitroGLYCERIN, ondansetron (ZOFRAN) IV   Vital Signs    Vitals:   06/20/22 2350 06/21/22 0200 06/21/22 0429 06/21/22 0933  BP: (!) 161/72 (!) 141/63 (!) 140/58 (!) 145/70  Pulse: 64  66 66  Resp: '17  18 18  '$ Temp: 98.8 F (37.1 C)  98.2 F (36.8 C) 98 F (36.7 C)  TempSrc: Oral  Oral Oral  SpO2: 97%  96% 96%  Weight:      Height:        Intake/Output Summary (Last 24 hours) at 06/21/2022 1000 Last data filed at 06/21/2022 0847 Gross per 24 hour  Intake 840 ml  Output 400 ml  Net 440 ml      06/20/2022   10:19 AM 01/30/2022    2:05 PM 01/04/2022    1:28 PM  Last 3 Weights  Weight (lbs) 155 lb 156 lb 9.6 oz 157 lb  Weight (kg) 70.308 kg 71.033 kg 71.215 kg      Telemetry    Normal sinus rhythm- Personally Reviewed  ECG    N/A - Personally Reviewed  Physical Exam   GEN: No acute distress.   Neck: No JVD Cardiac: RRR, no murmurs, rubs, or gallops.  Respiratory: Clear to auscultation bilaterally. GI: Soft, nontender, non-distended  MS: No edema; No deformity. Neuro:  Nonfocal  Psych: Normal affect   Labs    High Sensitivity Troponin:   Recent Labs  Lab 06/20/22 1022 06/20/22 1226 06/20/22 1455  TROPONINIHS 29* 82* 61*     Chemistry Recent Labs  Lab 06/20/22 1022 06/21/22 0140  NA 141 140  K 3.7 3.5  CL 102 105  CO2 27 24  GLUCOSE 147* 137*  BUN 20 12  CREATININE 0.78 0.68  CALCIUM 9.4 9.0  PROT  --  6.4*  ALBUMIN  --   3.7  AST  --  22  ALT  --  19  ALKPHOS  --  58  BILITOT  --  0.8  GFRNONAA >60 >60  ANIONGAP 12 11    Lipids  Recent Labs  Lab 06/21/22 0140  CHOL 157  TRIG 102  HDL 73  LDLCALC 64  CHOLHDL 2.2    Hematology Recent Labs  Lab 06/20/22 1022 06/21/22 0140  WBC 7.1 11.0*  RBC 4.61 4.21  HGB 14.1 12.9  HCT 42.8 38.8  MCV 92.8 92.2  MCH 30.6 30.6  MCHC 32.9 33.2  RDW 12.7 12.7  PLT 287 243   Thyroid  Recent Labs  Lab 06/21/22 0140  TSH 4.505*  FREET4 0.89    BNPNo results for input(s): "BNP", "PROBNP" in the last 168 hours.  DDimer  Recent Labs  Lab 06/20/22 1022  DDIMER 0.36     Radiology    US Venous Img Lower Unilateral Right  Result Date: 06/20/2022 CLINICAL DATA:  Right lower extremity swelling. EXAM: Right LOWER EXTREMITY VENOUS DOPPLER ULTRASOUND TECHNIQUE: Gray-scale sonography with compression, as well as color and  duplex ultrasound, were performed to evaluate the deep venous system(s) from the level of the common femoral vein through the popliteal and proximal calf veins. COMPARISON:  None Available. FINDINGS: VENOUS Normal compressibility of the common femoral, superficial femoral, and popliteal veins, as well as the visualized calf veins. Visualized portions of profunda femoral vein and great saphenous vein unremarkable. No filling defects to suggest DVT on grayscale or color Doppler imaging. Doppler waveforms show normal direction of venous flow, normal respiratory plasticity and response to augmentation. Limited views of the contralateral common femoral vein are unremarkable. OTHER None. Limitations: none IMPRESSION: Negative. Electronically Signed   By: Marijo Conception M.D.   On: 06/20/2022 14:35   DG Chest 2 View  Result Date: 06/20/2022 CLINICAL DATA:  Chest pain.  Hypertension with palpitations. EXAM: CHEST - 2 VIEW COMPARISON:  No prior chest radiographs available. Limited correlation made with cardiac CT 04/25/2021. FINDINGS: The heart size and  mediastinal contours are normal. The lungs are clear. There is no pleural effusion or pneumothorax. No acute osseous findings are identified. IMPRESSION: No evidence of active cardiopulmonary process. Electronically Signed   By: Richardean Sale M.D.   On: 06/20/2022 10:38    Cardiac Studies   Pending echo   Patient Profile     66 y.o. female with history of hyperlipidemia, chronic dyspnea on exertion and arthritis seen for chest pain/elevated blood pressure and tachycardia.  Patient had sudden episode of sharp/dull epigastric/lower sternal discomfort yesterday morning after breakfast.  Did not felt like GERD.  No associated palpitation but noted heart rate of 150 on Fitbit.  Blood pressure was elevated.  Came to ER for further evaluation.  Blood pressure was up but heart rate normal.  She never felt elevated heart rate or associated shortness of breath, diaphoresis, nausea or vomiting.  Assessment & Plan    Tachycardia - Noted heart rate in 150 at home on Fitbit.  Unfortunately tachycardia resolved spontaneously.  She did not felt palpitation but had chest discomfort. -No arrhythmia or tachycardia on telemetry -Continue beta-blocker -TSH almost normal with normal free T4 -Pending echocardiogram, if reassuring, likely discharge home on outpatient monitor -Patient is under significant amount of stress due to loss of close friend.  She is grieving.  Rule out stress-induced cardiomyopathy.  2.  Chest discomfort 3.  Elevated troponin -Troponin peaked at 82 and then trended down -Chest discomfort was atypical. -Likely her symptoms due to undiagnosed tachycardia -Continue beta-blocker -Calcium score of 0 on scoring last year. -Continue Crestor 20 mg  4.  Hyperlipidemia -Continue Crestor  For questions or updates, please contact [place] Please consult [email or web address] for contact info under    Signed, Leanor Kail, PA  06/21/2022, 10:00 AM

## 2022-06-21 NOTE — TOC Transition Note (Signed)
Transition of Care [facility]) - CM/SW Discharge Note   Patient Details  Name: Nicole Hall MRN: [id number] Date of Birth: 01/14/1956  [facility] CM/SW Contact:  Zenon Mayo, RN Phone Number: 06/21/2022, 11:50 AM   Clinical Narrative:    Patient is for dc today, she has no needs.          Patient Goals and CMS Choice        Discharge Placement                       Discharge Plan and Services                                     Social Determinants of Health (SDOH) Interventions     Readmission Risk Interventions     No data to display

## 2022-06-21 NOTE — Progress Notes (Signed)
ZIO AT applied at hospital. Dr. Johney Frame to read.

## 2022-06-21 NOTE — Consult Note (Signed)
   [facility] Inpatient Consult   06/21/2022   [employer] 1956-07-11 [id number]  [facility] [[facility]] Patient:   Primary Care Provider:  Cher Nakai, MD Merdis Delay informed he is no longer at [facility] [place]]   Met with patient and husband at bedside regarding follow up with PCP office and was given an appointment reminder card if they chose to stay with [facility]. They denied any needs at this time.   Plan:  No follow up with [facility] care management at this time.  For questions contact:   Natividad Brood, RN BSN [facility] Liaison  [phone] business mobile phone Toll free office [phone]  Fax number: [phone] [email or web address]

## 2022-06-21 NOTE — Care Management Obs Status (Signed)
MEDICARE OBSERVATION STATUS NOTIFICATION   Patient Details  Name: Nicole Hall MRN: [id number] Date of Birth: 19-Jul-1956   Medicare Observation Status Notification Given:  Yes    Zenon Mayo, RN 06/21/2022, 11:34 AM

## 2022-06-21 NOTE — Discharge Summary (Addendum)
Discharge Summary    Patient ID: Nicole Hall MRN: [id number]; DOB: [birth year]  Admit date: 06/20/2022 Discharge date: 06/21/2022  PCP:  Cher Nakai, MD   [facility] Cardiologist:  Freada Bergeron, MD   {  Discharge Diagnoses    Principal Problem:   Chest pain Active Problems:   NSTEMI (non-ST elevated myocardial infarction) ([place])   Palpitations   Elevated Blood pressure reading   Diagnostic Studies/Procedures    Echo Formal read is pending, normal EF on preliminary review    History of Present Illness     Nicole Hall is a 66 y.o. female with history of hyperlipidemia, chronic dyspnea on exertion and arthritis seen for chest pain/elevated blood pressure and tachycardia.   Patient had sudden episode of sharp/dull epigastric/lower sternal discomfort yesterday morning after breakfast.  Did not felt like GERD.  No associated palpitation but noted heart rate of 150 on Fitbit.  Blood pressure was elevated.  Came to ER for further evaluation.  Blood pressure was up but heart rate normal.  She never felt elevated heart rate or associated shortness of breath, diaphoresis, nausea or vomiting.  Hospital Course     Consultants: None  Tachycardia - Noted heart rate in 150 at home on Fitbit.  Unfortunately tachycardia resolved spontaneously.  She did not felt palpitation but had chest discomfort. -No arrhythmia or tachycardia on telemetry -Continue beta-blocker -TSH almost normal with normal free T4 -Pending echocardiogram reading, normal EF on ready by Dr. Oval Linsey -Will get live outpatient monitor -Patient is under significant amount of stress due to loss of close friend.    2.  Chest discomfort 3.  Elevated troponin -Troponin peaked at 82 and then trended down -Chest discomfort was atypical. -Likely her symptoms due to undiagnosed tachycardia -Continue beta-blocker -Calcium score of 0 on scoring last year. -Continue Crestor 20 mg   4.   Hyperlipidemia -Continue Crestor  5. Elevated BP - Started on BB with improvement   Did the patient have an acute coronary syndrome (MI, NSTEMI, STEMI, etc) this admission?:  No                               Did the patient have a percutaneous coronary intervention (stent / angioplasty)?:  No.     Discharge Vitals Blood pressure (!) 127/58, pulse 60, temperature 97.9 F (36.6 C), temperature source Oral, resp. rate 20, height 5' 4.35" (1.634 m), weight 70.3 kg, SpO2 96 %.  Filed Weights   06/20/22 1019  Weight: 70.3 kg    Labs & Radiologic Studies    CBC Recent Labs    06/20/22 1022 06/21/22 0140  WBC 7.1 11.0*  HGB 14.1 12.9  HCT 42.8 38.8  MCV 92.8 92.2  PLT 287 431   Basic Metabolic Panel Recent Labs    06/20/22 1022 06/21/22 0140  NA 141 140  K 3.7 3.5  CL 102 105  CO2 27 24  GLUCOSE 147* 137*  BUN 20 12  CREATININE 0.78 0.68  CALCIUM 9.4 9.0   Liver Function Tests Recent Labs    06/21/22 0140  AST 22  ALT 19  ALKPHOS 58  BILITOT 0.8  PROT 6.4*  ALBUMIN 3.7   High Sensitivity Troponin:   Recent Labs  Lab 06/20/22 1022 06/20/22 1226 06/20/22 1455  TROPONINIHS 29* 82* 61*   D-Dimer Recent Labs    06/20/22 1022  DDIMER 0.36   Hemoglobin A1C  Recent Labs    06/21/22 0140  HGBA1C 6.2*   Fasting Lipid Panel Recent Labs    06/21/22 0140  CHOL 157  HDL 73  LDLCALC 64  TRIG 102  CHOLHDL 2.2   Thyroid Function Tests Recent Labs    06/21/22 0140  TSH 4.505*   _____________  US Venous Img Lower Unilateral Right  Result Date: 06/20/2022 CLINICAL DATA:  Right lower extremity swelling. EXAM: Right LOWER EXTREMITY VENOUS DOPPLER ULTRASOUND TECHNIQUE: Gray-scale sonography with compression, as well as color and duplex ultrasound, were performed to evaluate the deep venous system(s) from the level of the common femoral vein through the popliteal and proximal calf veins. COMPARISON:  None Available. FINDINGS: VENOUS Normal compressibility  of the common femoral, superficial femoral, and popliteal veins, as well as the visualized calf veins. Visualized portions of profunda femoral vein and great saphenous vein unremarkable. No filling defects to suggest DVT on grayscale or color Doppler imaging. Doppler waveforms show normal direction of venous flow, normal respiratory plasticity and response to augmentation. Limited views of the contralateral common femoral vein are unremarkable. OTHER None. Limitations: none IMPRESSION: Negative. Electronically Signed   By: Marijo Conception M.D.   On: 06/20/2022 14:35   DG Chest 2 View  Result Date: 06/20/2022 CLINICAL DATA:  Chest pain.  Hypertension with palpitations. EXAM: CHEST - 2 VIEW COMPARISON:  No prior chest radiographs available. Limited correlation made with cardiac CT 04/25/2021. FINDINGS: The heart size and mediastinal contours are normal. The lungs are clear. There is no pleural effusion or pneumothorax. No acute osseous findings are identified. IMPRESSION: No evidence of active cardiopulmonary process. Electronically Signed   By: Richardean Sale M.D.   On: 06/20/2022 10:38   Disposition   Pt is being discharged home today in good condition.  Follow-up Plans & Appointments     Follow-up Information     [place], Crista Luria, [place] Follow up on 07/26/2022.   Specialty: Cardiology Why: '@10'$ :30am for hospital follow up Contact information: [place]  [id number] [phone]                Discharge Instructions     Diet - low sodium heart healthy   Complete by: As directed    Increase activity slowly   Complete by: As directed        Discharge Medications   Allergies as of 06/21/2022   No Known Allergies      Medication List     TAKE these medications    metoprolol tartrate 25 MG tablet Commonly known as: LOPRESSOR Take 1 tablet (25 mg total) by mouth 2 (two) times daily.   rosuvastatin 20 MG tablet Commonly known as: CRESTOR Take  1 tablet (20 mg total) by mouth daily.   Shingrix injection Generic drug: Zoster Vaccine Adjuvanted Inject into the muscle.   Vitamin D 50 MCG (2000 UT) Caps Take 1 capsule by mouth daily.           Outstanding Labs/Studies    Live monitor pending   Duration of Discharge Encounter   Greater than 30 minutes including physician time.  Jarrett Soho, PA 06/21/2022, 11:43 AM

## 2022-06-21 NOTE — Care Management CC44 (Signed)
Condition Code 44 Documentation Completed  Patient Details  Name: Nicole Hall MRN: [id number] Date of Birth: Feb 20, 1956   Condition Code 44 given:  Yes Patient signature on Condition Code 44 notice:  Yes Documentation of 2 MD's agreement:  Yes Code 44 added to claim:  Yes    Zenon Mayo, RN 06/21/2022, 11:34 AM

## 2022-06-22 DIAGNOSIS — R002 Palpitations: Secondary | ICD-10-CM | POA: Diagnosis not present

## 2022-06-22 LAB — LIPOPROTEIN A (LPA): Lipoprotein (a): 77.1 nmol/L — ABNORMAL HIGH (ref <75.0)

## 2022-07-03 DIAGNOSIS — H9312 Tinnitus, left ear: Secondary | ICD-10-CM | POA: Diagnosis not present

## 2022-07-03 DIAGNOSIS — H93293 Other abnormal auditory perceptions, bilateral: Secondary | ICD-10-CM | POA: Diagnosis not present

## 2022-07-03 DIAGNOSIS — H9202 Otalgia, left ear: Secondary | ICD-10-CM | POA: Diagnosis not present

## 2022-07-03 DIAGNOSIS — H903 Sensorineural hearing loss, bilateral: Secondary | ICD-10-CM | POA: Diagnosis not present

## 2022-07-06 ENCOUNTER — Other Ambulatory Visit (HOSPITAL_BASED_OUTPATIENT_CLINIC_OR_DEPARTMENT_OTHER): Payer: Self-pay

## 2022-07-12 ENCOUNTER — Other Ambulatory Visit (HOSPITAL_BASED_OUTPATIENT_CLINIC_OR_DEPARTMENT_OTHER): Payer: Self-pay

## 2022-07-12 DIAGNOSIS — Z6826 Body mass index (BMI) 26.0-26.9, adult: Secondary | ICD-10-CM | POA: Diagnosis not present

## 2022-07-12 DIAGNOSIS — J309 Allergic rhinitis, unspecified: Secondary | ICD-10-CM | POA: Diagnosis not present

## 2022-07-12 DIAGNOSIS — R5383 Other fatigue: Secondary | ICD-10-CM | POA: Diagnosis not present

## 2022-07-12 DIAGNOSIS — E785 Hyperlipidemia, unspecified: Secondary | ICD-10-CM | POA: Diagnosis not present

## 2022-07-12 DIAGNOSIS — I10 Essential (primary) hypertension: Secondary | ICD-10-CM | POA: Diagnosis not present

## 2022-07-12 MED ORDER — ZOSTER VAC RECOMB ADJUVANTED 50 MCG/0.5ML IM SUSR
INTRAMUSCULAR | 0 refills | Status: DC
  Filled 2022-07-12: qty 0.5, 1d supply, fill #0

## 2022-07-16 ENCOUNTER — Telehealth: Payer: Self-pay | Admitting: Cardiology

## 2022-07-16 NOTE — Telephone Encounter (Signed)
Nicole Hall with [employer] calling with abnormal cardiac monitor results

## 2022-07-16 NOTE — Telephone Encounter (Signed)
Call sent straight to triage. They just found that patient has a high grade AV Block on September 18th at 2:48 am, it was 4.7 seconds long. Will forward to Dr. Johney Frame, since this has been several weeks since this episode.

## 2022-07-16 NOTE — Telephone Encounter (Signed)
Per Dr. Johney Frame, looks like she has had a number of pauses overnight? Was she complaining of symptoms? She may need a sleep study.   Called patient about monitor. Patient stated she never had any symptoms while wearing the monitor. Patient stated she really needs to see Dr. Johney Frame and discuss her monitor and other things. Patient sounds anxious and concerned about her recent ED visit. Patient is eager to find out monitor's whole report and have a plan of care. She stated she did not have a heart attack, and that needs to be taken out of her ED visit notes.  Informed her that her discharge summery does state she did not have an acute coronary syndrome this admission. Made patient an appointment to see Dr. Johney Frame tomorrow on her DOD day. Patient agreed to time and date.

## 2022-07-16 NOTE — Progress Notes (Unsigned)
Cardiology Office Note:    Date:  07/16/2022   ID:  Nicole Hall, DOB 01-22-1956, MRN [id number]  PCP:  Cher Nakai, MD   [facility] Providers Cardiologist:  Freada Bergeron, MD {    Referring MD: Cher Nakai, MD    History of Present Illness:    Nicole Hall is a 66 y.o. female with a hx of HLD and arthritis who presents to clinic for follow-up.  Was initially seen in 03/2021 where she was having DOE with inclines and stairs. Ca score 04/2021 0. TTE 04/2021 with normal BiV function (EF 65-70%), no significant valve disease.  Was admitted 06/20/22-06/21/22 for epigastric pain and rapid HR at home. She was monitored on telemetry overnight with no evidence of arrhythmia. Initial trop 82 and then trended down. TTE 06/2022 with LVEF 60-65%, normal RV, no significant valve disease. ECG nonischemic. She was discharged home on a cardiac monitor.   Today, ***  Past Medical History:  Diagnosis Date   Superficial basal cell carcinoma (BCC) 08/15/2021   Right Breast (curet and 5Fu)    Past Surgical History:  Procedure Laterality Date   KNEE SURGERY Left     Current Medications: No outpatient medications have been marked as taking for the 07/17/22 encounter (Appointment) with Freada Bergeron, MD.     Allergies:   Patient has no known allergies.   Social History   Socioeconomic History   Marital status: Married    Spouse name: Not on file   Number of children: Not on file   Years of education: Not on file   Highest education level: Not on file  Occupational History   Not on file  Tobacco Use   Smoking status: Never    Passive exposure: Never   Smokeless tobacco: Never  Vaping Use   Vaping Use: Never used  Substance and Sexual Activity   Alcohol use: Yes    Alcohol/week: 7.0 standard drinks of alcohol    Types: 7 Glasses of wine per week   Drug use: No   Sexual activity: Yes    Partners: Male    Birth control/protection: Surgical    Comment: 1ST  INTERCOURSE- 16 , PARTNERS- 3  Other Topics Concern   Not on file  Social History Narrative   Not on file   Social Determinants of Health   Financial Resource Strain: Not on file  Food Insecurity: No Food Insecurity (06/21/2022)   Hunger Vital Sign    Worried About Running Out of Food in the Last Year: Never true    Ran Out of Food in the Last Year: Never true  Transportation Needs: No Transportation Needs (06/21/2022)   PRAPARE - Hydrologist (Medical): No    Lack of Transportation (Non-Medical): No  Physical Activity: Not on file  Stress: Not on file  Social Connections: Not on file     Family History: The patient's family history is not on file. She was adopted.  ROS:   Please see the history of present illness.    Review of Systems  Constitutional:  Negative for chills and fever.  HENT:  Negative for hearing loss.   Eyes:  Negative for blurred vision.  Respiratory:  Negative for shortness of breath.   Cardiovascular:  Negative for chest pain, palpitations, orthopnea, claudication, leg swelling and PND.  Gastrointestinal:  Negative for nausea and vomiting.  Genitourinary:  Negative for hematuria.  Musculoskeletal:  Positive for myalgias (bilateral LE). Negative for falls.  Neurological:  Positive for tingling (bilateral leg cramps). Negative for dizziness and loss of consciousness.  Endo/Heme/Allergies:  Negative for environmental allergies.  Psychiatric/Behavioral:  Negative for substance abuse.      EKGs/Labs/Other Studies Reviewed:    The following studies were reviewed today: TTE June 28, 2022: IMPRESSIONS   1. Left ventricular ejection fraction, by estimation, is 60 to 65%. Left  ventricular ejection fraction by 2D MOD biplane is 63.6 %. The left  ventricle has normal function. The left ventricle has no regional wall  motion abnormalities. Left ventricular  diastolic parameters were normal.   2. Right ventricular systolic function is low  normal. The right  ventricular size is normal. There is normal pulmonary artery systolic  pressure. The estimated right ventricular systolic pressure is 94.8 mmHg.   3. The mitral valve is grossly normal. Trivial mitral valve  regurgitation.   4. The aortic valve is tricuspid. Aortic valve regurgitation is not  visualized.   5. The inferior vena cava is normal in size with greater than 50%  respiratory variability, suggesting right atrial pressure of 3 mmHg.   Comparison(s): Changes from prior study are noted. 04/25/2021: LVEF 65-70%.  TTE 04/2021: IMPRESSIONS   1. Left ventricular ejection fraction, by estimation, is 65 to 70%. The  left ventricle has normal function. The left ventricle has no regional  wall motion abnormalities. There is mild left ventricular hypertrophy.  Left ventricular diastolic parameters  were normal. The average left ventricular global longitudinal strain is  -20.9 %.   2. Right ventricular systolic function is normal. The right ventricular  size is normal. There is normal pulmonary artery systolic pressure. The  estimated right ventricular systolic pressure is 54.6 mmHg.   3. The mitral valve is normal in structure. Trivial mitral valve  regurgitation.   4. The aortic valve was not well visualized. Aortic valve regurgitation  is not visualized. No aortic stenosis is present.   5. The inferior vena cava is normal in size with greater than 50%  respiratory variability, suggesting right atrial pressure of 3 mmHg.   Ca score 04/2021: FINDINGS: Coronary arteries: Normal origins.   Coronary Calcium Score:   Left main: 0   Left anterior descending artery: 0   Left circumflex artery: 0   Right coronary artery: 0   Total: 0   Percentile: 0   Pericardium: Normal.   Ascending Aorta: Normal caliber. Scattered calcifications in the ascending and descending aorta.   Non-cardiac: See separate report from [facility].   IMPRESSION: Coronary  calcium score of 0. This was 0 percentile for age-, race-, and sex-matched controls.  EKG:  EKG was not ordered today 04/05/21: NSR with HR 65  Recent Labs: 06-28-22: ALT 19; BUN 12; Creatinine, Ser 0.68; Hemoglobin 12.9; Platelets 243; Potassium 3.5; Sodium 140; TSH 4.505  Recent Lipid Panel    Component Value Date/Time   CHOL 157 2022/06/28 0140   TRIG 102 06/28/2022 0140   HDL 73 Jun 28, 2022 0140   CHOLHDL 2.2 06-28-22 0140   VLDL 20 Jun 28, 2022 0140   LDLCALC 64 2022/06/28 0140          Physical Exam:    VS:  There were no vitals taken for this visit.    Wt Readings from Last 3 Encounters:  06/20/22 155 lb (70.3 kg)  01/30/22 156 lb 9.6 oz (71 kg)  01/04/22 157 lb (71.2 kg)     GEN:  Well nourished, well developed in no acute distress HEENT: Normal NECK: No JVD; No carotid  bruits CARDIAC: RRR, 1/6 systolic flow murmur, no rubs or gallops RESPIRATORY:  Clear to auscultation without rales, wheezing or rhonchi  ABDOMEN: Soft, non-tender, non-distended MUSCULOSKELETAL:  No edema; No deformity, diminished pulses DP/TP SKIN: Warm and dry NEUROLOGIC:  Alert and oriented x 3 PSYCHIATRIC:  Normal affect   ASSESSMENT:    No diagnosis found.   PLAN:    In order of problems listed above:  #Palpitations: Patient with recent admission for palpitations with HR 150 on fitbit. Was monitored on telemetry without recurrence of arrhythmia. TTE with normal BiV function, no valve disease. Cardiac monitor with ****.   #Dyspnea on Exertion: Chronic and ongoing issue for years. Occurs with walking stairs and walking up an incline. Cardiac work-up reassuring. TTE with normal BiV function and no valve disease. Ca score 0.   #HLD: #Aortic atherosclerosis Ca score 0. Reports myalgias with her lipitor. Will trial crestor and monitor repsonse. -Continue crestor '20mg'$  daily  Medication Adjustments/Labs and Tests Ordered: Current medicines are reviewed at length with the patient  today.  Concerns regarding medicines are outlined above.  No orders of the defined types were placed in this encounter.   No orders of the defined types were placed in this encounter.   There are no Patient Instructions on file for this visit.   I,Mykaella Javier,acting as a scribe for Freada Bergeron, MD.,have documented all relevant documentation on the behalf of Freada Bergeron, MD,as directed by  Freada Bergeron, MD while in the presence of Freada Bergeron, MD.  I, Freada Bergeron, MD, have reviewed all documentation for this visit. The documentation on 07/16/22 for the exam, diagnosis, procedures, and orders are all accurate and complete.   Signed, Freada Bergeron, MD  07/16/2022 8:13 PM

## 2022-07-17 ENCOUNTER — Encounter: Payer: Self-pay | Admitting: Cardiology

## 2022-07-17 ENCOUNTER — Telehealth: Payer: Self-pay | Admitting: *Deleted

## 2022-07-17 ENCOUNTER — Ambulatory Visit: Payer: PPO | Attending: Cardiology | Admitting: Cardiology

## 2022-07-17 VITALS — BP 144/82 | HR 65 | Ht 65.0 in | Wt 157.6 lb

## 2022-07-17 DIAGNOSIS — R9431 Abnormal electrocardiogram [ECG] [EKG]: Secondary | ICD-10-CM | POA: Diagnosis not present

## 2022-07-17 DIAGNOSIS — R0602 Shortness of breath: Secondary | ICD-10-CM

## 2022-07-17 DIAGNOSIS — R002 Palpitations: Secondary | ICD-10-CM | POA: Diagnosis not present

## 2022-07-17 DIAGNOSIS — R0683 Snoring: Secondary | ICD-10-CM | POA: Diagnosis not present

## 2022-07-17 DIAGNOSIS — I2489 Other forms of acute ischemic heart disease: Secondary | ICD-10-CM | POA: Diagnosis not present

## 2022-07-17 DIAGNOSIS — E785 Hyperlipidemia, unspecified: Secondary | ICD-10-CM

## 2022-07-17 DIAGNOSIS — I441 Atrioventricular block, second degree: Secondary | ICD-10-CM | POA: Diagnosis not present

## 2022-07-17 NOTE — Progress Notes (Signed)
Cardiology Office Note:    Date:  07/17/2022   ID:  Nicole Hall, DOB 08-25-1956, MRN [id number]  PCP:  Cher Nakai, MD   [facility] Providers Cardiologist:  Freada Bergeron, MD {    Referring MD: Cher Nakai, MD    History of Present Illness:    Nicole Hall is a 66 y.o. female with a hx of HLD and arthritis who presents to clinic for follow-up.  Was initially seen in 03/2021 where she was having DOE with inclines and stairs. Ca score 04/2021 0. TTE 04/2021 with normal BiV function (EF 65-70%), no significant valve disease.  Was admitted 06/20/22-06/21/22 for epigastric pain and rapid HR at home. She was monitored on telemetry overnight with no evidence of arrhythmia. Initial trop 82 and then trended down. TTE 06/2022 with LVEF 60-65%, normal RV, no significant valve disease. ECG nonischemic. She was discharged home on a cardiac monitor.   Today, the patient reports an episode of central chest discomfort x1 month ago. This began while she was at home after she had eaten breakfast and got up to empty the dishwasher. She tried to wait for it to go away but it persisted after she had sat down and taken deep breaths. Her heart rate was 158 bpm after sitting. She took her blood pressure and it was high up to 194/82. However, she reports feeling very nervous at that time. A few days earlier she had been depressed because her best friend had passed away and she believes them to be related. The patient was seen in the ED afterward and admitted as noted above. She states that she has been anxious since her admission.  She denies any complaints of palpitations. No chest pain, SOB, orthopnea or PND.  The patient states that she does snore. She denies any known history of OSA. Cardiac monitor revealed several nocturnal pauses and was discussed with her today.  Past Medical History:  Diagnosis Date   Superficial basal cell carcinoma (BCC) 08/15/2021   Right Breast (curet and 5Fu)     Past Surgical History:  Procedure Laterality Date   KNEE SURGERY Left     Current Medications: Current Meds  Medication Sig   Cholecalciferol (VITAMIN D) 50 MCG (2000 UT) CAPS Take 1 capsule by mouth daily.   metoprolol tartrate (LOPRESSOR) 25 MG tablet Take 1 tablet (25 mg total) by mouth 2 (two) times daily.   rosuvastatin (CRESTOR) 20 MG tablet Take 1 tablet (20 mg total) by mouth daily.     Allergies:   Patient has no known allergies.   Social History   Socioeconomic History   Marital status: Married    Spouse name: Not on file   Number of children: Not on file   Years of education: Not on file   Highest education level: Not on file  Occupational History   Not on file  Tobacco Use   Smoking status: Never    Passive exposure: Never   Smokeless tobacco: Never  Vaping Use   Vaping Use: Never used  Substance and Sexual Activity   Alcohol use: Yes    Alcohol/week: 7.0 standard drinks of alcohol    Types: 7 Glasses of wine per week   Drug use: No   Sexual activity: Yes    Partners: Male    Birth control/protection: Surgical    Comment: 1ST INTERCOURSE- 16 , PARTNERS- 3  Other Topics Concern   Not on file  Social History Narrative   Not on  file   Social Determinants of Health   Financial Resource Strain: Not on file  Food Insecurity: No Food Insecurity (06/26/22)   Hunger Vital Sign    Worried About Running Out of Food in the Last Year: Never true    Ran Out of Food in the Last Year: Never true  Transportation Needs: No Transportation Needs (06/26/2022)   PRAPARE - Hydrologist (Medical): No    Lack of Transportation (Non-Medical): No  Physical Activity: Not on file  Stress: Not on file  Social Connections: Not on file     Family History: The patient's family history is not on file. She was adopted.  ROS:   Please see the history of present illness.    Review of Systems  Constitutional:  Negative for chills and fever.   HENT:  Negative for hearing loss.   Eyes:  Negative for blurred vision.  Respiratory:  Negative for shortness of breath.   Cardiovascular:  Positive for chest pain ("Funny" feeling in chest/pressure-like). Negative for palpitations, orthopnea, claudication, leg swelling and PND.  Gastrointestinal:  Negative for nausea and vomiting.  Genitourinary:  Negative for hematuria.  Musculoskeletal:  Negative for falls and myalgias.  Neurological:  Negative for dizziness and loss of consciousness.  Endo/Heme/Allergies:  Negative for environmental allergies.  Psychiatric/Behavioral:  Negative for substance abuse. The patient is nervous/anxious.      EKGs/Labs/Other Studies Reviewed:    The following studies were reviewed today: TTE 06/26/2022: IMPRESSIONS   1. Left ventricular ejection fraction, by estimation, is 60 to 65%. Left  ventricular ejection fraction by 2D MOD biplane is 63.6 %. The left  ventricle has normal function. The left ventricle has no regional wall  motion abnormalities. Left ventricular  diastolic parameters were normal.   2. Right ventricular systolic function is low normal. The right  ventricular size is normal. There is normal pulmonary artery systolic  pressure. The estimated right ventricular systolic pressure is 02.7 mmHg.   3. The mitral valve is grossly normal. Trivial mitral valve  regurgitation.   4. The aortic valve is tricuspid. Aortic valve regurgitation is not  visualized.   5. The inferior vena cava is normal in size with greater than 50%  respiratory variability, suggesting right atrial pressure of 3 mmHg.   Comparison(s): Changes from prior study are noted. 04/25/2021: LVEF 65-70%.  TTE 04/2021: IMPRESSIONS   1. Left ventricular ejection fraction, by estimation, is 65 to 70%. The  left ventricle has normal function. The left ventricle has no regional  wall motion abnormalities. There is mild left ventricular hypertrophy.  Left ventricular diastolic  parameters  were normal. The average left ventricular global longitudinal strain is  -20.9 %.   2. Right ventricular systolic function is normal. The right ventricular  size is normal. There is normal pulmonary artery systolic pressure. The  estimated right ventricular systolic pressure is 74.1 mmHg.   3. The mitral valve is normal in structure. Trivial mitral valve  regurgitation.   4. The aortic valve was not well visualized. Aortic valve regurgitation  is not visualized. No aortic stenosis is present.   5. The inferior vena cava is normal in size with greater than 50%  respiratory variability, suggesting right atrial pressure of 3 mmHg.   Ca score 04/2021: FINDINGS: Coronary arteries: Normal origins.   Coronary Calcium Score:   Left main: 0   Left anterior descending artery: 0   Left circumflex artery: 0   Right coronary artery: 0  Total: 0   Percentile: 0   Pericardium: Normal.   Ascending Aorta: Normal caliber. Scattered calcifications in the ascending and descending aorta.   Non-cardiac: See separate report from [facility].   IMPRESSION: Coronary calcium score of 0. This was 0 percentile for age-, race-, and sex-matched controls.  EKG:  EKG was not ordered today 04/05/21: NSR with HR 65  Recent Labs: 06/21/2022: ALT 19; BUN 12; Creatinine, Ser 0.68; Hemoglobin 12.9; Platelets 243; Potassium 3.5; Sodium 140; TSH 4.505  Recent Lipid Panel    Component Value Date/Time   CHOL 157 06/21/2022 0140   TRIG 102 06/21/2022 0140   HDL 73 06/21/2022 0140   CHOLHDL 2.2 06/21/2022 0140   VLDL 20 06/21/2022 0140   LDLCALC 64 06/21/2022 0140          Physical Exam:    VS:  BP (!) 144/82   Pulse 65   Ht '5\' 5"'$  (1.651 m)   Wt 157 lb 9.6 oz (71.5 kg)   SpO2 99%   BMI 26.23 kg/m     Wt Readings from Last 3 Encounters:  07/17/22 157 lb 9.6 oz (71.5 kg)  06/20/22 155 lb (70.3 kg)  01/30/22 156 lb 9.6 oz (71 kg)     GEN:  Well nourished, well  developed in no acute distress HEENT: Normal NECK: No JVD; No carotid bruits CARDIAC: RRR,  1/6 systolic flow murmur, no rubs or gallops RESPIRATORY:  Clear to auscultation without rales, wheezing or rhonchi  ABDOMEN: Soft, non-tender, non-distended MUSCULOSKELETAL:  No edema; No deformity, diminished pulses DP/TP SKIN: Warm and dry NEUROLOGIC:  Alert and oriented x 3 PSYCHIATRIC:  Normal affect   ASSESSMENT:    1. Palpitations   2. Holter monitor, abnormal   3. Snoring   4. Wenckebach   5. Hyperlipidemia, unspecified hyperlipidemia type   6. SOB (shortness of breath)   7. Demand ischemia     PLAN:    In order of problems listed above:  #Palpitations: Patient with recent admission for palpitations with HR 150 on fitbit. Was monitored on telemetry without recurrence of arrhythmia. TTE with normal BiV function, no valve disease. Cardiac monitor with with no sustained arrhythmias but with nocturnal pauses at night. She plans to purchase an apple watch to evaluate further.  -Will monitor with apple watch -Continue metop '25mg'$  BID  #Demand Ischemia: Trop mildly elevated to 82 during admission as detailed above. Likely demand in the setting of HR 150 and elevated blood pressure. No ischemic symptoms. TTE with normal BiV function.  #Nocturnal Pauses: #Snoring: Patient with up to 4.7s pauses overnight on cardiac monitor with history of snoring. Concerning for possible OSA. Will check sleep study for further evaluation. -Check sleep study  #Dyspnea on Exertion: Chronic and ongoing issue for years. Occurs with walking stairs and walking up an incline. Cardiac work-up reassuring. TTE with normal BiV function and no valve disease. Ca score 0.   #HLD: #Aortic atherosclerosis Ca score 0. Tolerating crestor -Continue crestor '20mg'$  daily  #HTN: BP initially elevated but improved to 130s on re-check. -Continue metop '25mg'$  BID -Follow BP at home and send in results  Medication  Adjustments/Labs and Tests Ordered: Current medicines are reviewed at length with the patient today.  Concerns regarding medicines are outlined above.  Orders Placed This Encounter  Procedures   Itamar Sleep Study    No orders of the defined types were placed in this encounter.   Patient Instructions  Medication Instructions:   Your physician recommends that  you continue on your current medications as directed. Please refer to the Current Medication list given to you today.  *If you need a refill on your cardiac medications before your next appointment, please call your pharmacy*   Testing/Procedures:  Your physician has recommended that you have a sleep study. This test records several body functions during sleep, including: brain activity, eye movement, oxygen and carbon dioxide blood levels, heart rate and rhythm, breathing rate and rhythm, the flow of air through your mouth and nose, snoring, body muscle movements, and chest and belly movement. ITAMAR SLEEP STUDY AS PROVIDED TO YOU TODAY IN CLINIC    Follow-Up: At [facility], you and your health needs are our priority.  As part of our continuing mission to provide you with exceptional heart care, we have created designated Provider Care Teams.  These Care Teams include your primary Cardiologist (physician) and Advanced Practice Providers (APPs -  Physician Assistants and Nurse Practitioners) who all work together to provide you with the care you need, when you need it.  We recommend signing up for the patient portal called "MyChart".  Sign up information is provided on this After Visit Summary.  MyChart is used to connect with patients for Virtual Visits (Telemedicine).  Patients are able to view lab/test results, encounter notes, upcoming appointments, etc.  Non-urgent messages can be sent to your provider as well.   To learn more about what you can do with MyChart, go to [email or web address].    Your next  appointment:   6 month(s)  The format for your next appointment:   In Person  Provider:   Robbie Lis, PA-C, Nicholes Rough, PA-C, Melina Copa, PA-C, Ambrose Pancoast, NP, Cecilie Kicks, NP, Ermalinda Barrios, PA-C, Christen Bame, NP, or Richardson Dopp, PA-C          Important Information About Sugar     '   This document serves as a record of services personally performed by Gwyndolyn Kaufman, MD. It was created on her behalf by Eugene Gavia, a trained medical scribe. The creation of this record is based on the scribe's personal observations and the provider's statements to them. This document has been checked and approved by the attending provider.  I, Freada Bergeron, MD, have reviewed all documentation for this visit. The documentation on 07/17/22 for the exam, diagnosis, procedures, and orders are all accurate and complete.   Signed, Freada Bergeron, MD  07/17/2022 5:27 PM

## 2022-07-17 NOTE — Patient Instructions (Signed)
Medication Instructions:   Your physician recommends that you continue on your current medications as directed. Please refer to the Current Medication list given to you today.  *If you need a refill on your cardiac medications before your next appointment, please call your pharmacy*   Testing/Procedures:  Your physician has recommended that you have a sleep study. This test records several body functions during sleep, including: brain activity, eye movement, oxygen and carbon dioxide blood levels, heart rate and rhythm, breathing rate and rhythm, the flow of air through your mouth and nose, snoring, body muscle movements, and chest and belly movement. ITAMAR SLEEP STUDY AS PROVIDED TO YOU TODAY IN CLINIC    Follow-Up: At [facility], you and your health needs are our priority.  As part of our continuing mission to provide you with exceptional heart care, we have created designated Provider Care Teams.  These Care Teams include your primary Cardiologist (physician) and Advanced Practice Providers (APPs -  Physician Assistants and Nurse Practitioners) who all work together to provide you with the care you need, when you need it.  We recommend signing up for the patient portal called "MyChart".  Sign up information is provided on this After Visit Summary.  MyChart is used to connect with patients for Virtual Visits (Telemedicine).  Patients are able to view lab/test results, encounter notes, upcoming appointments, etc.  Non-urgent messages can be sent to your provider as well.   To learn more about what you can do with MyChart, go to [email or web address].    Your next appointment:   6 month(s)  The format for your next appointment:   In Person  Provider:   Robbie Lis, PA-C, Nicholes Rough, PA-C, Melina Copa, PA-C, Ambrose Pancoast, NP, Cecilie Kicks, NP, Ermalinda Barrios, PA-C, Christen Bame, NP, or Richardson Dopp, PA-C          Important Information About Sugar     '

## 2022-07-17 NOTE — Telephone Encounter (Signed)
Dr. Johney Frame ordered an Itamar study while pt was in the office. Pt is agreeable to signed waiver, and to not open the box un til she has been called with the PIN#.

## 2022-07-17 NOTE — Addendum Note (Signed)
Encounter addended by: Markus Daft A on: 07/17/2022 5:34 PM  Actions taken: Imaging Exam ended

## 2022-07-24 NOTE — Telephone Encounter (Signed)
Called and made the patient aware that she may proceed with the [facility] Sleep Study. PIN # provided to the patient. Patient made aware that she will be contacted after the test has been read with the results and any recommendations. Patient verbalized understanding and thanked me for the call.    Pt has been given PIN# [id number]. Pt will do sleep study this week.

## 2022-07-24 NOTE — Telephone Encounter (Signed)
Prior Authorization for [facility] sent to [facility] via Fax . NO PA REQ

## 2022-07-26 ENCOUNTER — Ambulatory Visit: Payer: PPO | Admitting: Physician Assistant

## 2022-07-26 DIAGNOSIS — Z6826 Body mass index (BMI) 26.0-26.9, adult: Secondary | ICD-10-CM | POA: Diagnosis not present

## 2022-07-26 DIAGNOSIS — Z23 Encounter for immunization: Secondary | ICD-10-CM | POA: Diagnosis not present

## 2022-07-26 DIAGNOSIS — J309 Allergic rhinitis, unspecified: Secondary | ICD-10-CM | POA: Diagnosis not present

## 2022-07-26 DIAGNOSIS — E785 Hyperlipidemia, unspecified: Secondary | ICD-10-CM | POA: Diagnosis not present

## 2022-07-26 DIAGNOSIS — I10 Essential (primary) hypertension: Secondary | ICD-10-CM | POA: Diagnosis not present

## 2022-07-26 DIAGNOSIS — I471 Supraventricular tachycardia, unspecified: Secondary | ICD-10-CM | POA: Diagnosis not present

## 2022-07-28 ENCOUNTER — Encounter (HOSPITAL_BASED_OUTPATIENT_CLINIC_OR_DEPARTMENT_OTHER): Payer: PPO | Admitting: Cardiology

## 2022-07-28 DIAGNOSIS — G4733 Obstructive sleep apnea (adult) (pediatric): Secondary | ICD-10-CM | POA: Diagnosis not present

## 2022-07-29 NOTE — Procedures (Signed)
   Patient Information Study Date: 07/28/22 Patient Name: Nicole Hall Patient ID: [id number] Birth Date: 07/04/2056 Age: 66 Gender: Female BMI: 26.1 (W=156 lb, H=5' 5'') Referring Physician: Gwyndolyn Kaufman, MD  TEST DESCRIPTION: Home sleep apnea testing was completed using the WatchPat, a Type 1 device, utilizing peripheral arterial tonometry (PAT), chest movement, actigraphy, pulse oximetry, pulse rate, body position and snore. AHI was calculated with apnea and hypopnea using valid sleep time as the denominator. RDI includes apneas, hypopneas, and RERAs. The data acquired and the scoring of sleep and all associated events were performed in accordance with the recommended standards and specifications as outlined in the AASM Manual for the Scoring of Sleep and Associated Events 2.2.0 (2015).   FINDINGS:   1. Mild Obstructive Sleep Apnea with AHI 7.9/hr.   2. No Central Sleep Apnea with pAHIc 0/hr.   3. Oxygen desaturations as low as 80%.   4. Mild snoring was present. O2 sats were < 88% for 2.4 min.   5. Total sleep time was 8 hrs and 27 min.   6. 20.7% of total sleep time was spent in REM sleep.   7. Normal sleep onset latency at 16 min.   8. Prolonged REM sleep onset latency at 115 min.   9. Total awakenings were 6.  10. Arrhythmia detection:  None.  DIAGNOSIS: Mild Obstructive Sleep Apnea (G47.33)  RECOMMENDATIONS:   1.  Clinical correlation of these findings is necessary.  The decision to treat obstructive sleep apnea (OSA) is usually based on the presence of apnea symptoms or the presence of associated medical conditions such as Hypertension, Congestive Heart Failure, Atrial Fibrillation or Obesity.  The most common symptoms of OSA are snoring, gasping for breath while sleeping, daytime sleepiness and fatigue.   2.  Initiating apnea therapy is recommended given the presence of symptoms and/or associated conditions. Recommend proceeding with one of the following:     a.   Auto-CPAP therapy with a pressure range of 5-20cm H2O.     b.  An oral appliance (OA) that can be obtained from certain dentists with expertise in sleep medicine.  These are primarily of use in non-obese patients with mild and moderate disease.     c.  An ENT consultation which may be useful to look for specific causes of obstruction and possible treatment options.     d.  If patient is intolerant to PAP therapy, consider referral to ENT for evaluation for hypoglossal nerve stimulator.   3.  Close follow-up is necessary to ensure success with CPAP or oral appliance therapy for maximum benefit.  4.  A follow-up oximetry study on CPAP is recommended to assess the adequacy of therapy and determine the need for supplemental oxygen or the potential need for Bi-level therapy.  An arterial blood gas to determine the adequacy of baseline ventilation and oxygenation should also be considered.  5.  Healthy sleep recommendations include:  adequate nightly sleep (normal 7-9 hrs/night), avoidance of caffeine after noon and alcohol near bedtime, and maintaining a sleep environment that is cool, dark and quiet.  6.  Weight loss for overweight patients is recommended.  Even modest amounts of weight loss can significantly improve the severity of sleep apnea.  7.  Snoring recommendations include:  weight loss where appropriate, side sleeping, and avoidance of alcohol before bed.  8.  Operation of motor vehicle should be avoided when sleepy.  Signature: Fransico Him, MD; [facility]; [place], [place] Board of Sleep Medicine Electronically Signed: 07/29/22

## 2022-07-30 ENCOUNTER — Ambulatory Visit: Payer: PPO | Attending: Cardiology

## 2022-07-30 DIAGNOSIS — I441 Atrioventricular block, second degree: Secondary | ICD-10-CM

## 2022-07-30 DIAGNOSIS — R9431 Abnormal electrocardiogram [ECG] [EKG]: Secondary | ICD-10-CM

## 2022-07-30 DIAGNOSIS — R0683 Snoring: Secondary | ICD-10-CM

## 2022-08-08 ENCOUNTER — Ambulatory Visit: Payer: PPO | Admitting: Cardiology

## 2022-08-13 NOTE — Telephone Encounter (Signed)
-----   Message from Lauralee Evener, [place] sent at 07/30/2022  8:29 AM EDT -----  ----- Message ----- From: Sueanne Margarita, MD Sent: 07/29/2022   7:54 PM EDT To: [facility] Sleep Studies  Patient has very mild OSA - set up OV to discuss treatment options.

## 2022-08-13 NOTE — Telephone Encounter (Signed)
The patient has been notified of the result and verbalized understanding.  All questions (if any) were answered. Marolyn Hammock, [place] 08/13/2022 2:07 PM    Sent to Imagene Gurney to set up OV to discuss treatment options.

## 2022-08-16 NOTE — Telephone Encounter (Signed)
Patient scheduled for 08/17/22 at 10:20am

## 2022-08-17 ENCOUNTER — Encounter: Payer: Self-pay | Admitting: Cardiology

## 2022-08-17 ENCOUNTER — Ambulatory Visit: Payer: PPO | Attending: Cardiology | Admitting: Cardiology

## 2022-08-17 VITALS — BP 144/82 | HR 63 | Ht 65.0 in | Wt 155.8 lb

## 2022-08-17 DIAGNOSIS — G4733 Obstructive sleep apnea (adult) (pediatric): Secondary | ICD-10-CM | POA: Diagnosis not present

## 2022-08-17 NOTE — Patient Instructions (Signed)
Medication Instructions:  Your physician recommends that you continue on your current medications as directed. Please refer to the Current Medication list given to you today.  *If you need a refill on your cardiac medications before your next appointment, please call your pharmacy*  Follow-Up: At [facility], you and your health needs are our priority.  As part of our continuing mission to provide you with exceptional heart care, we have created designated Provider Care Teams.  These Care Teams include your primary Cardiologist (physician) and Advanced Practice Providers (APPs -  Physician Assistants and Nurse Practitioners) who all work together to provide you with the care you need, when you need it.   Your next appointment:   Follow up with Dr. Radford Pax as needed  Important Information About Sugar

## 2022-08-17 NOTE — Progress Notes (Signed)
Sleep Medicine CONSULT Note    Date:  08/17/2022   ID:  STUART GUILLEN, DOB [birth year], MRN [id number]  PCP:  Cher Nakai, MD  Cardiologist: Gwyndolyn Kaufman, MD  Chief Complaint  Patient presents with   New Patient (Initial Visit)    Obstructive sleep apnea    History of Present Illness:  Nicole Hall is a 66 y.o. female who is being seen today for the evaluation of obstructive sleep apnea at the request of Gwyndolyn Kaufman, MD.  This is a 66 year old female with a history of chest pain and tachycardia  She was noted to have 4.7 second pauses overnight on cardiac monitor and a home along with snoring with concern for possible obstructive sleep apnea.   She underwent home sleep study which showed mild obstructive sleep apnea with AHI 7.9/h and O2 saturations as low as 80% but did not qualify as nocturnal hypoxemia.  She is now here to discuss the results.  She has restorative sleep and has no excessive daytime sleepiness. She denies any problems with HAs, dry mouth during sleep.  Her husband is here with her and says that during her deepest sleep early in the am that she would have some mild snoring but no witnessed apneas.  She has never awakened gasping for breath or snoring.   Past Medical History:  Diagnosis Date   Superficial basal cell carcinoma (BCC) 08/15/2021   Right Breast (curet and 5Fu)    Past Surgical History:  Procedure Laterality Date   KNEE SURGERY Left     Current Medications: Current Meds  Medication Sig   Cholecalciferol (VITAMIN D) 50 MCG (2000 UT) CAPS Take 1 capsule by mouth daily.   metoprolol tartrate (LOPRESSOR) 25 MG tablet Take 1 tablet (25 mg total) by mouth 2 (two) times daily.   rosuvastatin (CRESTOR) 20 MG tablet Take 1 tablet (20 mg total) by mouth daily.    Allergies:   Patient has no known allergies.   Social History   Socioeconomic History   Marital status: Married    Spouse name: Not on file   Number of children: Not on file    Years of education: Not on file   Highest education level: Not on file  Occupational History   Not on file  Tobacco Use   Smoking status: Never    Passive exposure: Never   Smokeless tobacco: Never  Vaping Use   Vaping Use: Never used  Substance and Sexual Activity   Alcohol use: Yes    Alcohol/week: 7.0 standard drinks of alcohol    Types: 7 Glasses of wine per week   Drug use: No   Sexual activity: Yes    Partners: Male    Birth control/protection: Surgical    Comment: 1ST INTERCOURSE- 16 , PARTNERS- 3  Other Topics Concern   Not on file  Social History Narrative   Not on file   Social Determinants of Health   Financial Resource Strain: Not on file  Food Insecurity: No Food Insecurity (06/21/2022)   Hunger Vital Sign    Worried About Running Out of Food in the Last Year: Never true    Ran Out of Food in the Last Year: Never true  Transportation Needs: No Transportation Needs (06/21/2022)   PRAPARE - Hydrologist (Medical): No    Lack of Transportation (Non-Medical): No  Physical Activity: Not on file  Stress: Not on file  Social Connections: Not on file  Family History:  The patient's family history is not on file. She was adopted.   ROS:   Please see the history of present illness.    ROS All other systems reviewed and are negative.      No data to display             PHYSICAL EXAM:   VS:  BP (!) 144/82   Pulse 63   Ht '5\' 5"'$  (1.651 m)   Wt 155 lb 12.8 oz (70.7 kg)   SpO2 96%   BMI 25.93 kg/m    GEN: Well nourished, well developed, in no acute distress  HEENT: normal  Neck: no JVD, carotid bruits, or masses Cardiac: RRR; no murmurs, rubs, or gallops,no edema.  Intact distal pulses bilaterally.  Respiratory:  clear to auscultation bilaterally, normal work of breathing GI: soft, nontender, nondistended, + BS MS: no deformity or atrophy  Skin: warm and dry, no rash Neuro:  Alert and Oriented x 3, Strength and  sensation are intact Psych: euthymic mood, full affect  Wt Readings from Last 3 Encounters:  08/17/22 155 lb 12.8 oz (70.7 kg)  07/17/22 157 lb 9.6 oz (71.5 kg)  06/20/22 155 lb (70.3 kg)      Studies/Labs Reviewed:   Home sleep study  Recent Labs: 06/21/2022: ALT 19; BUN 12; Creatinine, Ser 0.68; Hemoglobin 12.9; Platelets 243; Potassium 3.5; Sodium 140; TSH 4.505     ASSESSMENT:    1. OSA (obstructive sleep apnea)      PLAN:  In order of problems listed above:  OSA -she has no sx of excessive daytime sleepiness and StopBang score is 0 -In review of her sleep test her AHI was only elevated during supine sleep and she says that she tries to avoid sleeping supine -I do not think that her minimal OSA is causing nocturnal sinus pauses and would not recommend treatment at this time as I think CPAP would be more disruptive to her sleep -encouraged her to avoid sleeping supine  2.  HTN -Bp mildly elevated here but has white coat HTN and BP is well controlled at home -she currently is on Lopressor and with hx of sinus pauses during sleep>>may want to consider changing BB to a different BP medicine  3.  Nocturnal sinus pauses -noted on heart monitor -I do not think this is from very minimal OSA -would consider stopping BB and consider other antihypertensive meds if needed>>patient would like to stop all meds for BP if possible and I recommended that she talk with Dr. Johney Frame  Time Spent: 20 minutes total time of encounter, including 15 minutes spent in face-to-face patient care on the date of this encounter. This time includes coordination of care and counseling regarding above mentioned problem list. Remainder of non-face-to-face time involved reviewing chart documents/testing relevant to the patient encounter and documentation in the medical record. I have independ20ently reviewed documentation from referring provider  Medication Adjustments/Labs and Tests Ordered: Current  medicines are reviewed at length with the patient today.  Concerns regarding medicines are outlined above.  Medication changes, Labs and Tests ordered today are listed in the Patient Instructions below.  There are no Patient Instructions on file for this visit.   Signed, Fransico Him, MD  08/17/2022 10:46 AM    [place] [address], [place], [place]  [address] Phone: [phone]; Fax: [phone]

## 2022-08-21 ENCOUNTER — Ambulatory Visit: Payer: PPO | Admitting: Dermatology

## 2022-09-04 ENCOUNTER — Other Ambulatory Visit: Payer: Self-pay

## 2022-09-04 MED ORDER — METOPROLOL TARTRATE 25 MG PO TABS: 25.0000 mg | ORAL_TABLET | Freq: Two times a day (BID) | ORAL | 3 refills | Status: DC

## 2022-09-04 NOTE — Telephone Encounter (Signed)
Pt's medication was sent to pt's pharmacy as requested. Confirmation received.  °

## 2022-09-10 ENCOUNTER — Telehealth: Payer: Self-pay | Admitting: Cardiology

## 2022-09-10 NOTE — Telephone Encounter (Unsigned)
Pt is calling to inform Dr. Johney Frame that she is still experiencing unilateral lower extremity swelling to her right foot and ankle.    Pt states she's had work-up recently in September for this and had a LE venous US done at that time, which came back within normal limits.  Pt states this is still ongoing but has worsened in the last week or so.  Pt states the swelling to her right foot is now going up into her ankle making it very uncomfortable for her by the end of the day.  She states by the end of the day, her right foot and ankle are so swollen to the point everything feels tight and stretchy.  She denies swelling to her right calf or leg area.  Her left lower extremity is completely normal with no swelling.  She reports no acute weight gain.  She denies sob, chest pain, dizziness, pre-syncope, or any other cardiac complaints.  She denies redness or streaking to her right extremity.  She denies any warmth or discoloration to the right lower extremity.  She denies any s/s of infection to that extremity.    She states she is able to move the right foot/ankle with no difficulty.  She denies any pain to the right foot and ankle.   Pt states she avoids salt in her diet and prepares all her food at home.   She states she has compression stockings but has not worn them yet.  Advised her to go ahead and start wearing her compressions during the day.  Advised her to elevate her extremities at rest and continue maintaining a low sodium diet.   Scheduled her to come in to be seen by Dr. Johney Frame for this Wednesday 12/6 at 4 pm for evaluation, given ongoing and worsening complaint, and to make sure there's no concerning factors to that right foot/ankle.   Pt aware I will still route this message to Dr. Johney Frame to further review and advise as needed, but we will plan on seeing her on 12/6.  Pt verbalized understanding and agrees with this plan.

## 2022-09-10 NOTE — Telephone Encounter (Signed)
Pt c/o swelling: STAT is pt has developed SOB within 24 hours  If swelling, where is the swelling located? Right ankle and foot  How much weight have you gained and in what time span? Not sure  Have you gained 3 pounds in a day or 5 pounds in a week? no  Do you have a log of your daily weights (if so, list)? no  Are you currently taking a fluid pill? no  Are you currently SOB? no  Have you traveled recently? No   Patient states for the last week or so her right ankle and right foot have been swollen all day long instead of just the evening. She says she was seen in the hospital in the past and had an ultrasound and it came back normal.

## 2022-09-11 NOTE — Progress Notes (Unsigned)
Cardiology Office Note:    Date:  09/12/2022   ID:  Nicole Hall, DOB 1956-05-20, MRN [id number]  PCP:  Cher Nakai, MD   [facility] Providers Cardiologist:  Freada Bergeron, MD {    Referring MD: Cher Nakai, MD    History of Present Illness:    Nicole Hall is a 66 y.o. female with a hx of HLD and arthritis who presents to clinic for follow-up.  Was initially seen in 03/2021 where she was having DOE with inclines and stairs. Ca score 04/2021 0. TTE 04/2021 with normal BiV function (EF 65-70%), no significant valve disease.  Was admitted 06/20/22-06/21/22 for epigastric pain and rapid HR at home. She was monitored on telemetry overnight with no evidence of arrhythmia. Initial trop 82 and then trended down. TTE 06/2022 with LVEF 60-65%, normal RV, no significant valve disease. ECG nonischemic. She was discharged home on a cardiac monitor.   Was last seen in clinic on 07/17/22 where she was having palpitations. Given that she had just had a cardiac monitor, she wished to monitor closely with her apple watch. Sleep study showed AHI only elevated during supine sleep and she was deemed not needed for treatment for minimal OSA.   Called clinic with right ankle swelling. Prior venous study negative for DVT. Given symptoms, she is now returning to clinic for evaluation.  Today, the patient states that her right ankle has been very swollen for the past 2 weeks. No injury to the ankle but did fall and hit her knee last week , but her symptoms preceded her knee injury. Notably, this began after Thanksgiving where she had ham and has persisted since that time. No chest pain, palpitations, SOB, orthopnea or PND. Remains very active without anginal symptoms. LE doppler without evidence of DVT.  Past Medical History:  Diagnosis Date   Superficial basal cell carcinoma (BCC) 08/15/2021   Right Breast (curet and 5Fu)    Past Surgical History:  Procedure Laterality Date   KNEE SURGERY Left      Current Medications: Current Meds  Medication Sig   Cholecalciferol (VITAMIN D) 50 MCG (2000 UT) CAPS Take 1 capsule by mouth daily.   furosemide (LASIX) 20 MG tablet Take 1 tablet (20 mg total) by mouth daily as needed (for lower extremity swelling).   metoprolol tartrate (LOPRESSOR) 25 MG tablet Take 1 tablet (25 mg total) by mouth 2 (two) times daily.   potassium chloride SA (KLOR-CON M) 20 MEQ tablet Take 1 tablet (20 mEq total) by mouth daily as needed (take when you administer as needed lasix.).   rosuvastatin (CRESTOR) 20 MG tablet Take 1 tablet (20 mg total) by mouth daily.     Allergies:   Patient has no known allergies.   Social History   Socioeconomic History   Marital status: Married    Spouse name: Not on file   Number of children: Not on file   Years of education: Not on file   Highest education level: Not on file  Occupational History   Not on file  Tobacco Use   Smoking status: Never    Passive exposure: Never   Smokeless tobacco: Never  Vaping Use   Vaping Use: Never used  Substance and Sexual Activity   Alcohol use: Yes    Alcohol/week: 7.0 standard drinks of alcohol    Types: 7 Glasses of wine per week   Drug use: No   Sexual activity: Yes    Partners: Male  Birth control/protection: Surgical    Comment: 1ST INTERCOURSE- 16 , PARTNERS- 3  Other Topics Concern   Not on file  Social History Narrative   Not on file   Social Determinants of Health   Financial Resource Strain: Not on file  Food Insecurity: No Food Insecurity (July 05, 2022)   Hunger Vital Sign    Worried About Running Out of Food in the Last Year: Never true    Ran Out of Food in the Last Year: Never true  Transportation Needs: No Transportation Needs (2022/07/05)   PRAPARE - Hydrologist (Medical): No    Lack of Transportation (Non-Medical): No  Physical Activity: Not on file  Stress: Not on file  Social Connections: Not on file     Family  History: The patient's family history is not on file. She was adopted.  ROS:   Please see the history of present illness.    Review of Systems  Constitutional:  Negative for chills and fever.  HENT:  Negative for hearing loss.   Eyes:  Negative for blurred vision.  Respiratory:  Negative for shortness of breath.   Cardiovascular:  Positive for leg swelling. Negative for chest pain, palpitations, orthopnea, claudication and PND.  Gastrointestinal:  Negative for nausea and vomiting.  Genitourinary:  Negative for hematuria.  Musculoskeletal:  Negative for falls and myalgias.  Neurological:  Negative for dizziness and loss of consciousness.  Endo/Heme/Allergies:  Negative for environmental allergies.  Psychiatric/Behavioral:  Negative for substance abuse. The patient is nervous/anxious.      EKGs/Labs/Other Studies Reviewed:    The following studies were reviewed today: TTE 2022-07-05: IMPRESSIONS   1. Left ventricular ejection fraction, by estimation, is 60 to 65%. Left  ventricular ejection fraction by 2D MOD biplane is 63.6 %. The left  ventricle has normal function. The left ventricle has no regional wall  motion abnormalities. Left ventricular  diastolic parameters were normal.   2. Right ventricular systolic function is low normal. The right  ventricular size is normal. There is normal pulmonary artery systolic  pressure. The estimated right ventricular systolic pressure is 93.2 mmHg.   3. The mitral valve is grossly normal. Trivial mitral valve  regurgitation.   4. The aortic valve is tricuspid. Aortic valve regurgitation is not  visualized.   5. The inferior vena cava is normal in size with greater than 50%  respiratory variability, suggesting right atrial pressure of 3 mmHg.   Comparison(s): Changes from prior study are noted. 04/25/2021: LVEF 65-70%.  TTE 04/2021: IMPRESSIONS   1. Left ventricular ejection fraction, by estimation, is 65 to 70%. The  left ventricle has  normal function. The left ventricle has no regional  wall motion abnormalities. There is mild left ventricular hypertrophy.  Left ventricular diastolic parameters  were normal. The average left ventricular global longitudinal strain is  -20.9 %.   2. Right ventricular systolic function is normal. The right ventricular  size is normal. There is normal pulmonary artery systolic pressure. The  estimated right ventricular systolic pressure is 67.1 mmHg.   3. The mitral valve is normal in structure. Trivial mitral valve  regurgitation.   4. The aortic valve was not well visualized. Aortic valve regurgitation  is not visualized. No aortic stenosis is present.   5. The inferior vena cava is normal in size with greater than 50%  respiratory variability, suggesting right atrial pressure of 3 mmHg.   Ca score 04/2021: FINDINGS: Coronary arteries: Normal origins.   Coronary  Calcium Score:   Left main: 0   Left anterior descending artery: 0   Left circumflex artery: 0   Right coronary artery: 0   Total: 0   Percentile: 0   Pericardium: Normal.   Ascending Aorta: Normal caliber. Scattered calcifications in the ascending and descending aorta.   Non-cardiac: See separate report from [facility].   IMPRESSION: Coronary calcium score of 0. This was 0 percentile for age-, race-, and sex-matched controls.  EKG:  EKG was not ordered today 04/05/21: NSR with HR 65  Recent Labs: 06/21/2022: ALT 19; BUN 12; Creatinine, Ser 0.68; Hemoglobin 12.9; Platelets 243; Potassium 3.5; Sodium 140; TSH 4.505  Recent Lipid Panel    Component Value Date/Time   CHOL 157 06/21/2022 0140   TRIG 102 06/21/2022 0140   HDL 73 06/21/2022 0140   CHOLHDL 2.2 06/21/2022 0140   VLDL 20 06/21/2022 0140   LDLCALC 64 06/21/2022 0140     STOP-Bang Score:  1       Physical Exam:    VS:  BP 122/78   Pulse 60   Ht '5\' 5"'$  (1.651 m)   Wt 159 lb (72.1 kg)   SpO2 98%   BMI 26.46 kg/m     Wt  Readings from Last 3 Encounters:  09/12/22 159 lb (72.1 kg)  08/17/22 155 lb 12.8 oz (70.7 kg)  07/17/22 157 lb 9.6 oz (71.5 kg)     GEN:  Well nourished, well developed in no acute distress HEENT: Normal NECK: No JVD; No carotid bruits CARDIAC: RRR,  1/6 systolic flow murmur, no rubs or gallops RESPIRATORY:  Clear to auscultation without rales, wheezing or rhonchi  ABDOMEN: Soft, non-tender, non-distended MUSCULOSKELETAL: 2+ LE edema on right ankle and foot. Warm SKIN: Warm and dry NEUROLOGIC:  Alert and oriented x 3 PSYCHIATRIC:  Normal affect   ASSESSMENT:    1. Bilateral lower extremity edema   2. Medication management   3. Palpitations   4. Snoring   5. Demand ischemia   6. SOB (shortness of breath)   7. Wenckebach   8. Aortic atherosclerosis ([place])   9. Pure hypercholesterolemia     PLAN:    In order of problems listed above:  #RLE Edema: RLE venous doppler negative for DVT in 06/2022. TTE with normal BiV function and no significant valve disease. Suspect this is related to chronic venous stasis and significant salt intake during holiday. Will start lasix and monitor response. Encouraged compression socks and leg elevation as well. Can do venous reflux study as well is symptoms do not improve. -Start lasix '20mg'$  daily until symptoms resolve and then as needed for edema thereafter -Start K+ 48mq with lasix  -BMET next week -Compression socks and leg elevation  #Palpitations: Patient with recent admission for palpitations with HR 150 on fitbit. Was monitored on telemetry without recurrence of arrhythmia. TTE with normal BiV function, no valve disease. Cardiac monitor with with no sustained arrhythmias but with nocturnal pauses at night. She plans to purchase an apple watch to evaluate further.  -Will monitor with apple watch -Stop metop due to nocturnal pauses at night in the absence of significant OSA  #Demand Ischemia: Trop mildly elevated to 82 during admission as  detailed above. Likely demand in the setting of HR 150 and elevated blood pressure. No ischemic symptoms. TTE with normal BiV function.  #Nocturnal Pauses: Patient with up to 4.7s pauses overnight on cardiac monitor. Sleep study with minimal OSA and deemed not to need CPAP. Will  stop metop. -Stop monitor  #Dyspnea on Exertion: Chronic and ongoing issue for years. Occurs with walking stairs and walking up an incline. Cardiac work-up reassuring. TTE with normal BiV function and no valve disease. Ca score 0.   #HLD: #Aortic atherosclerosis Ca score 0. Tolerating crestor -Continue crestor '20mg'$  daily  #HTN: -Stop metop due to pauses as above -Monitor at home  Medication Adjustments/Labs and Tests Ordered: Current medicines are reviewed at length with the patient today.  Concerns regarding medicines are outlined above.  Orders Placed This Encounter  Procedures   Basic metabolic panel    Meds ordered this encounter  Medications   furosemide (LASIX) 20 MG tablet    Sig: Take 1 tablet (20 mg total) by mouth daily as needed (for lower extremity swelling).    Dispense:  30 tablet    Refill:  3   potassium chloride SA (KLOR-CON M) 20 MEQ tablet    Sig: Take 1 tablet (20 mEq total) by mouth daily as needed (take when you administer as needed lasix.).    Dispense:  30 tablet    Refill:  3    Take in concurrence with PRN lasix.    Patient Instructions  Medication Instructions:   START TAKING FUROSEMIDE (LASIX) 20 MG BY MOUTH DAILY AS NEEDED FOR LOWER EXTREMITY SWELLING  START TAKING POTASSIUM CHLORIDE 20 mEq BY MOUTH DAILY AS NEEDED WHEN YOU ADMINISTER YOUR AS NEEDED LASIX  *If you need a refill on your cardiac medications before your next appointment, please call your pharmacy*   Lab Work:  [place] OFFICE--BMET  If you have labs (blood work) drawn today and your tests are completely normal, you will receive your results only by: [place] (if you have  MyChart) OR A paper copy in the mail If you have any lab test that is abnormal or we need to change your treatment, we will call you to review the results.    Follow-Up:  3 MONTHS WITH DAYNA DUNN PA-C IN THE OFFICE  Other Instructions  PLEASE START WEARING YOUR COMPRESSION STOCKINGS DURING THE DAY   Important Information About Sugar        Signed, Freada Bergeron, MD  09/12/2022 5:21 PM

## 2022-09-12 ENCOUNTER — Ambulatory Visit: Payer: PPO | Attending: Cardiology | Admitting: Cardiology

## 2022-09-12 ENCOUNTER — Encounter: Payer: Self-pay | Admitting: Cardiology

## 2022-09-12 VITALS — BP 122/78 | HR 60 | Ht 65.0 in | Wt 159.0 lb

## 2022-09-12 DIAGNOSIS — R0683 Snoring: Secondary | ICD-10-CM

## 2022-09-12 DIAGNOSIS — I441 Atrioventricular block, second degree: Secondary | ICD-10-CM | POA: Diagnosis not present

## 2022-09-12 DIAGNOSIS — I2489 Other forms of acute ischemic heart disease: Secondary | ICD-10-CM | POA: Diagnosis not present

## 2022-09-12 DIAGNOSIS — R002 Palpitations: Secondary | ICD-10-CM

## 2022-09-12 DIAGNOSIS — Z79899 Other long term (current) drug therapy: Secondary | ICD-10-CM

## 2022-09-12 DIAGNOSIS — R600 Localized edema: Secondary | ICD-10-CM

## 2022-09-12 DIAGNOSIS — E7800 Pure hypercholesterolemia, unspecified: Secondary | ICD-10-CM | POA: Diagnosis not present

## 2022-09-12 DIAGNOSIS — I700 Atherosclerosis of aorta: Secondary | ICD-10-CM

## 2022-09-12 DIAGNOSIS — R0602 Shortness of breath: Secondary | ICD-10-CM | POA: Diagnosis not present

## 2022-09-12 MED ORDER — POTASSIUM CHLORIDE CRYS ER 20 MEQ PO TBCR: 20.0000 meq | EXTENDED_RELEASE_TABLET | Freq: Every day | ORAL | 3 refills | Status: DC | PRN

## 2022-09-12 MED ORDER — FUROSEMIDE 20 MG PO TABS: 20.0000 mg | ORAL_TABLET | Freq: Every day | ORAL | 3 refills | Status: DC | PRN

## 2022-09-12 NOTE — Patient Instructions (Signed)
Medication Instructions:   START TAKING FUROSEMIDE (LASIX) 20 MG BY MOUTH DAILY AS NEEDED FOR LOWER EXTREMITY SWELLING  START TAKING POTASSIUM CHLORIDE 20 mEq BY MOUTH DAILY AS NEEDED WHEN YOU ADMINISTER YOUR AS NEEDED LASIX  *If you need a refill on your cardiac medications before your next appointment, please call your pharmacy*   Lab Work:  [place] OFFICE--BMET  If you have labs (blood work) drawn today and your tests are completely normal, you will receive your results only by: [place] (if you have MyChart) OR A paper copy in the mail If you have any lab test that is abnormal or we need to change your treatment, we will call you to review the results.    Follow-Up:  3 MONTHS WITH DAYNA DUNN PA-C IN THE OFFICE  Other Instructions  PLEASE START WEARING YOUR COMPRESSION STOCKINGS DURING THE DAY   Important Information About Sugar

## 2022-09-12 NOTE — Telephone Encounter (Signed)
Message  Completely agree with your plan. Will see her 12/6.

## 2022-09-20 ENCOUNTER — Telehealth: Payer: Self-pay | Admitting: Cardiology

## 2022-09-20 ENCOUNTER — Ambulatory Visit: Payer: PPO | Attending: Cardiology

## 2022-09-20 DIAGNOSIS — R600 Localized edema: Secondary | ICD-10-CM

## 2022-09-20 DIAGNOSIS — Z79899 Other long term (current) drug therapy: Secondary | ICD-10-CM | POA: Diagnosis not present

## 2022-09-20 LAB — BASIC METABOLIC PANEL
BUN/Creatinine Ratio: 15 (ref 12–28)
BUN: 12 mg/dL (ref 8–27)
CO2: 25 mmol/L (ref 20–29)
Calcium: 9.3 mg/dL (ref 8.7–10.3)
Chloride: 100 mmol/L (ref 96–106)
Creatinine, Ser: 0.8 mg/dL (ref 0.57–1.00)
Glucose: 119 mg/dL — ABNORMAL HIGH (ref 70–99)
Potassium: 4.3 mmol/L (ref 3.5–5.2)
Sodium: 140 mmol/L (ref 134–144)
eGFR: 80 mL/min/{1.73_m2} (ref >59)

## 2022-09-20 NOTE — Telephone Encounter (Signed)
Pt was calling to go over her AVS with med instructions at last OV.   Went over this with her verbatim and she clearly understood them.  Pt appreciative for all the assistance provided.

## 2022-09-20 NOTE — Telephone Encounter (Signed)
Pt c/o medication issue:  1. Name of Medication: furosemide (LASIX) 20 MG tablet   2. How are you currently taking this medication (dosage and times per day)?   Take 1 tablet (20 mg total) by mouth daily as needed (for lower extremity swelling)    3. Are you having a reaction (difficulty breathing--STAT)? no  4. What is your medication issue? Patient calling with questions. How long is she suppose to be on medication, can she skip a dose here and there (because it makes her go to the bathroom so much), and was told to stop her bp medication. Please advise

## 2022-09-21 ENCOUNTER — Other Ambulatory Visit: Payer: PPO

## 2022-10-24 DIAGNOSIS — M25561 Pain in right knee: Secondary | ICD-10-CM | POA: Diagnosis not present

## 2022-11-06 DIAGNOSIS — Z6825 Body mass index (BMI) 25.0-25.9, adult: Secondary | ICD-10-CM | POA: Diagnosis not present

## 2022-11-06 DIAGNOSIS — I471 Supraventricular tachycardia, unspecified: Secondary | ICD-10-CM | POA: Diagnosis not present

## 2022-11-06 DIAGNOSIS — I10 Essential (primary) hypertension: Secondary | ICD-10-CM | POA: Diagnosis not present

## 2022-11-06 DIAGNOSIS — E785 Hyperlipidemia, unspecified: Secondary | ICD-10-CM | POA: Diagnosis not present

## 2022-11-06 DIAGNOSIS — J309 Allergic rhinitis, unspecified: Secondary | ICD-10-CM | POA: Diagnosis not present

## 2022-11-20 DIAGNOSIS — J069 Acute upper respiratory infection, unspecified: Secondary | ICD-10-CM | POA: Diagnosis not present

## 2022-11-20 DIAGNOSIS — I471 Supraventricular tachycardia, unspecified: Secondary | ICD-10-CM | POA: Diagnosis not present

## 2022-11-20 DIAGNOSIS — Z6826 Body mass index (BMI) 26.0-26.9, adult: Secondary | ICD-10-CM | POA: Diagnosis not present

## 2022-11-20 DIAGNOSIS — J309 Allergic rhinitis, unspecified: Secondary | ICD-10-CM | POA: Diagnosis not present

## 2022-11-20 DIAGNOSIS — E785 Hyperlipidemia, unspecified: Secondary | ICD-10-CM | POA: Diagnosis not present

## 2022-11-20 DIAGNOSIS — I10 Essential (primary) hypertension: Secondary | ICD-10-CM | POA: Diagnosis not present

## 2022-12-04 ENCOUNTER — Inpatient Hospital Stay: Payer: PPO

## 2022-12-04 ENCOUNTER — Other Ambulatory Visit: Payer: Self-pay

## 2022-12-04 DIAGNOSIS — Z148 Genetic carrier of other disease: Secondary | ICD-10-CM

## 2022-12-05 ENCOUNTER — Inpatient Hospital Stay: Payer: PPO | Attending: Internal Medicine | Admitting: Genetic Counselor

## 2022-12-05 DIAGNOSIS — Z800 Family history of malignant neoplasm of digestive organs: Secondary | ICD-10-CM | POA: Diagnosis not present

## 2022-12-05 DIAGNOSIS — Z8041 Family history of malignant neoplasm of ovary: Secondary | ICD-10-CM

## 2022-12-05 NOTE — Progress Notes (Unsigned)
REFERRING PROVIDER: Cher Nakai, MD [place],  [place] [address]  PRIMARY PROVIDER:  Cher Nakai, MD  PRIMARY REASON FOR VISIT:  Encounter Diagnoses  Name Primary?   Family history of colon cancer Yes   Family history of ovarian cancer    HISTORY OF PRESENT ILLNESS:   Nicole Hall, a 67 y.o. female, was seen for a [facility] cancer genetics consultation at the request of Dr. Truman Hayward due to a family history of cancer.  Nicole Hall presents to clinic today to discuss the possibility of a hereditary predisposition to cancer, to discuss genetic testing, and to further clarify her future cancer risks, as well as potential cancer risks for family members.   Nicole Hall is a 67 y.o. female with no personal history of cancer.    CANCER HISTORY:  Oncology History   No history exists.   RISK FACTORS:  Menarche was at age 75.  First live birth at age 71.  OCP use for approximately  <1 year.  Ovaries intact: yes.  Uterus intact: yes.  Menopausal status: postmenopausal, menopause in her mid 37s HRT use: 0 years. Colonoscopy: yes; normal. Mammogram within the last year: yes. Number of breast biopsies: 0. Any excessive radiation exposure in the past: no  Past Medical History:  Diagnosis Date   Superficial basal cell carcinoma (BCC) 08/15/2021   Right Breast (curet and 5Fu)    Past Surgical History:  Procedure Laterality Date   KNEE SURGERY Left     Social History   Socioeconomic History   Marital status: Married    Spouse name: Not on file   Number of children: Not on file   Years of education: Not on file   Highest education level: Not on file  Occupational History   Not on file  Tobacco Use   Smoking status: Never    Passive exposure: Never   Smokeless tobacco: Never  Vaping Use   Vaping Use: Never used  Substance and Sexual Activity   Alcohol use: Yes    Alcohol/week: 7.0 standard drinks of alcohol    Types: 7 Glasses of wine per week   Drug use: No    Sexual activity: Yes    Partners: Male    Birth control/protection: Surgical    Comment: 1ST INTERCOURSE- 16 , PARTNERS- 3  Other Topics Concern   Not on file  Social History Narrative   Not on file   Social Determinants of Health   Financial Resource Strain: Not on file  Food Insecurity: No Food Insecurity (06/21/2022)   Hunger Vital Sign    Worried About Running Out of Food in the Last Year: Never true    Ran Out of Food in the Last Year: Never true  Transportation Needs: No Transportation Needs (06/21/2022)   PRAPARE - Hydrologist (Medical): No    Lack of Transportation (Non-Medical): No  Physical Activity: Not on file  Stress: Not on file  Social Connections: Not on file     FAMILY HISTORY:  We obtained a detailed, 4-generation family history.  Significant diagnoses are listed below: Family History  Adopted: Yes  Problem Relation Age of Onset   Colon cancer Maternal Aunt    Colon cancer Maternal Uncle    Ovarian cancer Maternal Grandmother 47 - 28       possibly uterine     Nicole Hall has 9 maternal aunts/uncles. One aunt was diagnosed with colon cancer, she is deceased. Another  aunt had a colostomy bag, possibly due to colon cancer, she is deceased. One maternal uncle was diagnosed with colon cancer. Her maternal grandmother was diagnosed with ovarian cancer (possibly uterine cancer) in her 30s, she died in her 74s. Nicole Hall is unaware of previous family history of genetic testing for hereditary cancer risks. There is reported paternal Ashkenazi Jewish ancestry.   GENETIC COUNSELING ASSESSMENT: Nicole Hall is a 67 y.o. female with a family history of cancer which is somewhat suggestive of a hereditary predisposition to cancer. We, therefore, discussed and recommended the following at today's visit.   DISCUSSION: We discussed that 5 - 10% of cancer is hereditary, with most cases of hereditary colon cancer associated with Lynch Syndrome.  There are  other genes that can be associated with hereditary colon and ovarian cancer syndromes.  We discussed that testing is beneficial for several reasons, including knowing about other cancer risks, identifying potential screening and risk-reduction options that may be appropriate, and to understanding if other family members could be at risk for cancer and allowing them to undergo genetic testing.  We reviewed the characteristics, features and inheritance patterns of hereditary cancer syndromes. We also discussed genetic testing, including the appropriate family members to test, the process of testing, insurance coverage and turn-around-time for results. We discussed the implications of a negative, positive, carrier and/or variant of uncertain significant result. We discussed that negative results would be uninformative given that [name] does not have a personal history of cancer. We recommended Nicole Hall pursue genetic testing for a panel that contains genes associated with colon, uterine, and ovarian cancer.  Nicole Hall was offered a common hereditary cancer panel (48 genes) and an expanded pan-cancer panel (70 genes). Nicole Hall was informed of the benefits and limitations of each panel, including that expanded pan-cancer panels contain several genes that do not have clear management guidelines at this point in time.  We also discussed that as the number of genes included on a panel increases, the chances of variants of uncertain significance increases.  After considering the benefits and limitations of each gene panel, Nicole Hall elected to have [facility] Multi-Cancer Panel.  The Multi-Cancer + RNA Panel offered by [facility] includes sequencing and/or deletion/duplication analysis of the following 70 genes:  AIP*, ALK, APC*, ATM*, AXIN2*, BAP1*, BARD1*, BLM*, BMPR1A*, BRCA1*, BRCA2*, BRIP1*, CDC73*, CDH1*, CDK4, CDKN1B*, CDKN2A, CHEK2*, CTNNA1*, DICER1*, EPCAM (del/dup only), EGFR, FH*, FLCN*, GREM1 (promoter dup  only), HOXB13, KIT, LZTR1, MAX*, MBD4, MEN1*, MET, MITF, MLH1*, MSH2*, MSH3*, MSH6*, MUTYH*, NF1*, NF2*, NTHL1*, PALB2*, PDGFRA, PMS2*, POLD1*, POLE*, POT1*, PRKAR1A*, PTCH1*, PTEN*, RAD51C*, RAD51D*, RB1*, RET, SDHA* (sequencing only), SDHAF2*, SDHB*, SDHC*, SDHD*, SMAD4*, SMARCA4*, SMARCB1*, SMARCE1*, STK11*, SUFU*, TMEM127*, TP53*, TSC1*, TSC2*, VHL*. RNA analysis is performed for * genes.   Based on Ms. Hochman's personal and family history of cancer, she meets medical criteria for genetic testing. Despite that she meets criteria, she may still have an out of pocket cost. We discussed that if her out of pocket cost for testing is over $100, the laboratory will call and confirm whether she wants to proceed with testing.  If the out of pocket cost of testing is less than $100 she will be billed by the genetic testing laboratory.   A federal law called the Genetic Information Non-Discrimination Act (GINA) of 2008 helps protect individuals against genetic discrimination based on their genetic test results.  It impacts both health insurance and employment.  With health insurance, it protects against increased premiums,  being kicked off insurance or being forced to take a test in order to be insured.  For employment it protects against hiring, firing and promoting decisions based on genetic test results.  GINA does not apply to those in the [employer], those who work for companies with less than 15 employees, and new life insurance or long-term disability insurance policies.  Health status due to a cancer diagnosis is not protected under GINA.  PLAN: After considering the risks, benefits, and limitations, Ms. Rudzik provided informed consent to pursue genetic testing and the blood sample was sent to [facility] for analysis of the Multi-Cancer Panel. Results should be available within approximately 2-3 weeks' time, at which point they will be disclosed by telephone to Ms. Winiarski, as will any additional  recommendations warranted by these results. Ms. Hudson will receive a summary of her genetic counseling visit and a copy of her results once available. This information will also be available in Epic.   Ms. Hsiao questions were answered to her satisfaction today. Our contact information was provided should additional questions or concerns arise. Thank you for the referral and allowing [place] to share in the care of your patient.   [name], MS, [facility] Genetic Counselor [email or web address] (P) [phone]  The patient was seen for a total of 20 minutes in phone visit genetic counseling. The patient was seen alone.  Drs. Lindi Adie and/or Burr Medico were available to discuss this case as needed.  _______________________________________________________________________ For Office Staff:  Number of people involved in session: 1 Was an Intern/ student involved with case: no

## 2022-12-06 ENCOUNTER — Inpatient Hospital Stay: Payer: PPO

## 2022-12-06 ENCOUNTER — Encounter: Payer: Self-pay | Admitting: Genetic Counselor

## 2022-12-06 ENCOUNTER — Other Ambulatory Visit: Payer: Self-pay

## 2022-12-06 DIAGNOSIS — Z800 Family history of malignant neoplasm of digestive organs: Secondary | ICD-10-CM | POA: Insufficient documentation

## 2022-12-06 DIAGNOSIS — Z8041 Family history of malignant neoplasm of ovary: Secondary | ICD-10-CM | POA: Insufficient documentation

## 2022-12-06 LAB — GENETIC SCREENING ORDER

## 2022-12-20 ENCOUNTER — Encounter: Payer: Self-pay | Admitting: Genetic Counselor

## 2022-12-20 ENCOUNTER — Telehealth: Payer: Self-pay | Admitting: Genetic Counselor

## 2022-12-20 DIAGNOSIS — Z1379 Encounter for other screening for genetic and chromosomal anomalies: Secondary | ICD-10-CM | POA: Insufficient documentation

## 2022-12-20 NOTE — Telephone Encounter (Signed)
I contacted Nicole Hall to discuss her genetic testing results. No pathogenic variants were identified in the 70 genes analyzed. Detailed clinic note to follow.  The test report has been scanned into EPIC and is located under the Molecular Pathology section of the Results Review tab.  A portion of the result report is included below for reference.   [name], MS, [facility] Genetic Counselor [name] (P) [phone]

## 2022-12-21 ENCOUNTER — Ambulatory Visit: Payer: PPO | Admitting: Nurse Practitioner

## 2022-12-24 ENCOUNTER — Ambulatory Visit: Payer: Self-pay | Admitting: Genetic Counselor

## 2022-12-24 ENCOUNTER — Encounter: Payer: Self-pay | Admitting: Genetic Counselor

## 2022-12-24 DIAGNOSIS — Z1379 Encounter for other screening for genetic and chromosomal anomalies: Secondary | ICD-10-CM

## 2022-12-24 NOTE — Progress Notes (Signed)
HPI:   Nicole Hall was previously seen in the [place] clinic due to a family history of cancer and concerns regarding a hereditary predisposition to cancer. Please refer to our prior cancer genetics clinic note for more information regarding our discussion, assessment and recommendations, at the time. Ms. Mompremier recent genetic test results were disclosed to her, as were recommendations warranted by these results. These results and recommendations are discussed in more detail below.  CANCER HISTORY:  Oncology History   No history exists.    FAMILY HISTORY:  We obtained a detailed, 4-generation family history.  Significant diagnoses are listed below:      Family History  Adopted: Yes  Problem Relation Age of Onset   Colon cancer Maternal Aunt     Colon cancer Maternal Uncle     Ovarian cancer Maternal Grandmother 73 - 41        possibly uterine       Ms. Acree has 9 maternal aunts/uncles. One aunt was diagnosed with colon cancer, she is deceased. Another aunt had a colostomy bag, possibly due to colon cancer, she is deceased. One maternal uncle was diagnosed with colon cancer. Her maternal grandmother was diagnosed with ovarian cancer (possibly uterine cancer) in her 77s, she died in her 29s. Ms. Bredesen is unaware of previous family history of genetic testing for hereditary cancer risks. There is reported paternal Ashkenazi Jewish ancestry.   GENETIC TEST RESULTS:  The [facility] Multi-Cancer Panel found no pathogenic mutations.  The Multi-Cancer + RNA Panel offered by [facility] includes sequencing and/or deletion/duplication analysis of the following 70 genes:  AIP*, ALK, APC*, ATM*, AXIN2*, BAP1*, BARD1*, BLM*, BMPR1A*, BRCA1*, BRCA2*, BRIP1*, CDC73*, CDH1*, CDK4, CDKN1B*, CDKN2A, CHEK2*, CTNNA1*, DICER1*, EPCAM (del/dup only), EGFR, FH*, FLCN*, GREM1 (promoter dup only), HOXB13, KIT, LZTR1, MAX*, MBD4, MEN1*, MET, MITF, MLH1*, MSH2*, MSH3*, MSH6*, MUTYH*, NF1*, NF2*, NTHL1*,  PALB2*, PDGFRA, PMS2*, POLD1*, POLE*, POT1*, PRKAR1A*, PTCH1*, PTEN*, RAD51C*, RAD51D*, RB1*, RET, SDHA* (sequencing only), SDHAF2*, SDHB*, SDHC*, SDHD*, SMAD4*, SMARCA4*, SMARCB1*, SMARCE1*, STK11*, SUFU*, TMEM127*, TP53*, TSC1*, TSC2*, VHL*. RNA analysis is performed for * genes.  The test report has been scanned into EPIC and is located under the Molecular Pathology section of the Results Review tab.  A portion of the result report is included below for reference. Genetic testing reported out on 12/15/2022.        Even though a pathogenic variant was not identified, possible explanations for the cancer in the family may include: There may be no hereditary risk for cancer in the family. The cancers in her family may be due to other genetic or environmental factors. There may be a gene mutation in one of these genes that current testing methods cannot detect, but that chance is small. There could be another gene that has not yet been discovered, or that we have not yet tested, that is responsible for the cancer diagnoses in the family.  It is also possible there is a hereditary cause for the cancer in the family that Ms. Ekblad did not inherit.  Therefore, it is important to remain in touch with cancer genetics in the future so that we can continue to offer Ms. Husser the most up to date genetic testing.   ADDITIONAL GENETIC TESTING:  We discussed with Ms. Amon that her genetic testing was fairly extensive.  If there are genes identified to increase cancer risk that can be analyzed in the future, we would be happy to discuss and coordinate this testing  at that time.    CANCER SCREENING RECOMMENDATIONS:  Ms. Herder test result is considered negative (normal).  This means that we have not identified a hereditary cause for her family history of cancer at this time.   An individual's cancer risk and medical management are not determined by genetic test results alone. Overall cancer risk assessment  incorporates additional factors, including personal medical history, family history, and any available genetic information that may result in a personalized plan for cancer prevention and surveillance. Therefore, it is recommended she continue to follow the cancer management and screening guidelines provided by her primary healthcare provider.  RECOMMENDATIONS FOR FAMILY MEMBERS:   Since she did not inherit a mutation in a cancer predisposition gene included on this panel, her children could not have inherited a mutation from her in one of these genes. Other members of the family may still carry a pathogenic variant in one of these genes that Ms. Melnik did not inherit. Based on the family history, we recommend her maternal uncle who was diagnosed with colon cancer have genetic counseling and testing.   FOLLOW-UP:  Cancer genetics is a rapidly advancing field and it is possible that new genetic tests will be appropriate for her and/or her family members in the future. We encouraged her to remain in contact with cancer genetics on an annual basis so we can update her personal and family histories and let her know of advances in cancer genetics that may benefit this family.   Our contact number was provided. Ms. Eccleston questions were answered to her satisfaction, and she knows she is welcome to call us at anytime with additional questions or concerns.   [name], MS, [facility] Genetic Counselor [email or web address] (P) [phone]

## 2023-01-01 ENCOUNTER — Other Ambulatory Visit: Payer: Self-pay | Admitting: Internal Medicine

## 2023-01-01 DIAGNOSIS — Z1231 Encounter for screening mammogram for malignant neoplasm of breast: Secondary | ICD-10-CM

## 2023-02-06 ENCOUNTER — Other Ambulatory Visit: Payer: Self-pay

## 2023-02-06 MED ORDER — ROSUVASTATIN CALCIUM 20 MG PO TABS: 20.0000 mg | ORAL_TABLET | Freq: Every day | ORAL | 3 refills | Status: DC

## 2023-02-28 ENCOUNTER — Ambulatory Visit
Admission: RE | Admit: 2023-02-28 | Discharge: 2023-02-28 | Disposition: A | Discharge status: Home / Self Care | Payer: PPO | Source: Ambulatory Visit | Attending: Internal Medicine | Admitting: Internal Medicine

## 2023-02-28 DIAGNOSIS — Z1231 Encounter for screening mammogram for malignant neoplasm of breast: Secondary | ICD-10-CM

## 2023-04-16 ENCOUNTER — Ambulatory Visit (HOSPITAL_BASED_OUTPATIENT_CLINIC_OR_DEPARTMENT_OTHER): Payer: PPO | Admitting: Cardiovascular Disease

## 2023-04-16 ENCOUNTER — Encounter (HOSPITAL_BASED_OUTPATIENT_CLINIC_OR_DEPARTMENT_OTHER): Payer: Self-pay | Admitting: Cardiovascular Disease

## 2023-04-16 VITALS — BP 158/72 | HR 90 | Ht 65.0 in | Wt 155.8 lb

## 2023-04-16 DIAGNOSIS — R600 Localized edema: Secondary | ICD-10-CM | POA: Insufficient documentation

## 2023-04-16 DIAGNOSIS — I872 Venous insufficiency (chronic) (peripheral): Secondary | ICD-10-CM | POA: Diagnosis not present

## 2023-04-16 HISTORY — DX: Localized edema: R60.0

## 2023-04-16 NOTE — Progress Notes (Signed)
Cardiology Office Note:  .    Date:  04/16/2023  ID:  Nicole Hall, DOB Apr 10, 1956, MRN [id number] PCP: Simone Curia, MD  [facility] Providers Cardiologist:  Meriam Sprague, MD     History of Present Illness: .    Nicole Hall is a 67 y.o. female with hyperlipidemia, here for follow-up. She was previously seen by other cardiology providers 03/2021 with exertional dyspnea. Calcium score 04/2021 was 0. Echo at that time revealed LVEF 65 to 70% and no significant abnormalities. She was admitted 06/2022 with epigastric pain and palpitations. Telemetry was unremarkable. Initial troponin was 82 and down trended. Echo remained unremarkable and she was discharged home with a cardiac monitor. Monitor 07/2022 revealed 5 episodes of SVT lasting up to 4 beats and several overnight pauses up to 4.7 seconds. She had a sleep study that revealed minimal OSA and no treatment was recommended. Metoprolol was discontinued. She saw Dr. Shari Prows 09/2022 due to right ankle swelling. She was given a short course of furosemide; it was thought to be due to increased sodium intake.   Today, she is accompanied by a family member. She states she is feeling great, aside from her main concern of bilateral LE swelling, R>L. Her swelling is worse in her right foot. For a time her swelling was more stable and improved by the following morning. She is usually on her feet all day at baseline. Since April, she has noticed increased swelling correlating with the higher humidity. Due to the more severe swelling she has had some associated leg pain, usually more bothersome by 4 PM on a typical day. She hasn't been wearing her compression socks given the hot weather. Of note, she confirms a prior mechanical fall 09/08/23. She had missed a concrete step and went down on her right knee; x-ray a few weeks afterwards was reportedly negative. In the office today her blood pressure is initially elevated to 158/82, with recheck at  158/72.  She notes that her blood pressure is always higher in the office. She had been monitoring at home, until her BP cuff was found to be inaccurate at another clinic visit. She is taking her metoprolol once daily, instead of BID as prescribed. Affirms that generally she doesn't feel any palpitations. Regarding her diet, she tries to eat really healthy without any added sodium. No smoking history since her 20's; she may drink up to one glass of wine at night. She reports that her lipid panel 04/2020 showed HDL 72, LDL 199. We discussed that statin therapy is recommended from a cardiovascular perspective. She denies any chest pain, shortness of breath, lightheadedness, headaches, syncope, orthopnea, or PND.  ROS:  Please see the history of present illness. All other systems are reviewed and negative.  (+) Bilateral LE swelling, R>L, with some associated pain  Studies Reviewed: .        Risk Assessment/Calculations:     HYPERTENSION CONTROL Vitals:   04/16/23 0922 04/16/23 0927 04/16/23 1006  BP: (!) 156/73 (!) 158/82 (!) 158/72    The patient's blood pressure is elevated above target today.  In order to address the patient's elevated BP: Blood pressure will be monitored at home to determine if medication changes need to be made.; A referral to the [facility] will be placed.      STOP-Bang Score:  1      Physical Exam:    VS:  BP (!) 158/72 (BP Location: Left Arm, Patient Position: Sitting, Cuff Size:  Normal)   Pulse 90   Ht 5\' 5"  (1.651 m)   Wt 155 lb 12.8 oz (70.7 kg)   BMI 25.93 kg/m  , BMI Body mass index is 25.93 kg/m. GENERAL:  Well appearing HEENT: Pupils equal round and reactive, fundi not visualized, oral mucosa unremarkable NECK:  No jugular venous distention, waveform within normal limits, carotid upstroke brisk and symmetric, no bruits, no thyromegaly LUNGS:  Clear to auscultation bilaterally HEART:  RRR.  PMI not displaced or sustained,S1 and S2  within normal limits, no S3, no S4, no clicks, no rubs, no murmurs ABD:  Flat, positive bowel sounds normal in frequency in pitch, no bruits, no rebound, no guarding, no midline pulsatile mass, no hepatomegaly, no splenomegaly EXT:  2 plus pulses throughout, + LE edema with venous stasis changes R>L, no cyanosis no clubbing SKIN:  No rashes no nodules NEURO:  Cranial nerves II through XII grossly intact, motor grossly intact throughout PSYCH:  Cognitively intact, oriented to person place and time  Wt Readings from Last 3 Encounters:  04/16/23 155 lb 12.8 oz (70.7 kg)  09/12/22 159 lb (72.1 kg)  08/17/22 155 lb 12.8 oz (70.7 kg)     ASSESSMENT AND PLAN: .       # Right Lower Extremity Edema: More pronounced in the right foot, worsens with humidity and prolonged standing. History of knee injury but unlikely this type of trauma caused vein issues.  No associated dyspnea or chest pain. Previous ultrasound was negative for venous insufficiency. -Continue compression stockings as tolerated, especially during periods of prolonged standing. -Refer to Vascular Surgery for further evaluation and potential treatment options.  # Hypertension: Elevated blood pressure readings in office, patient reports high readings at home as well. Currently on Metoprolol Tartrate once daily. -Enroll in remote patient monitoring study to obtain more accurate home blood pressure readings.  She consents to monitoring in the [employer] remote patient monitoring. -Increase Metoprolol Tartrate to twice daily until further assessment.  # Hyperlipidemia: LDL 199 prior to initiation of statin therapy. Currently on Rosuvastatin. -Continue Rosuvastatin daily. -Discussed the benefits of statin therapy in reducing vascular disease and associated risk of Alzheimer's/dementia.             Dispo:  FU with Sarkis Rhines C. Duke Salvia, MD, [facility] in 4 months.  I,Mathew Stumpf,acting as a Neurosurgeon for Chilton Si, MD.,have documented all  relevant documentation on the behalf of Chilton Si, MD,as directed by  Chilton Si, MD while in the presence of Chilton Si, MD.  I, Othel Hoogendoorn C. Duke Salvia, MD have reviewed all documentation for this visit.  The documentation of the exam, diagnosis, procedures, and orders on 04/16/2023 are all accurate and complete.   Signed, Chilton Si, MD

## 2023-04-16 NOTE — Addendum Note (Signed)
Addended by: Regis Bill B on: 04/16/2023 11:32 AM   Modules accepted: Orders

## 2023-04-16 NOTE — Patient Instructions (Addendum)
Medication Instructions:  Your physician recommends that you continue on your current medications as directed. Please refer to the Current Medication list given to you today.    Labwork: NONE   Testing/Procedures: NONE   Follow-Up: 05/13/2023 1:00 WITH PHARM D    Referrals:  TO VASCULAR  IF YOU DO NOT HEAR FROM THEM IN 2 WEEKS YOU CAN CALL THEM DIRECTLY TO FOLLOW UP   WEAR COMPRESSION SOCKS   Special Instructions:  MONITOR YOUR BLOOD PRESSURE TWICE A DAY, LOG IN THE BOOK PROVIDED. BRING THE BOOK AND YOUR BLOOD PRESSURE MACHINE TO YOUR FOLLOW UP IN 1 MONTH    DASH Eating Plan DASH stands for "Dietary Approaches to Stop Hypertension." The DASH eating plan is a healthy eating plan that has been shown to reduce high blood pressure (hypertension). It may also reduce your risk for type 2 diabetes, heart disease, and stroke. The DASH eating plan may also help with weight loss. What are tips for following this plan?  General guidelines Avoid eating more than 2,300 mg (milligrams) of salt (sodium) a day. If you have hypertension, you may need to reduce your sodium intake to 1,500 mg a day. Limit alcohol intake to no more than 1 drink a day for nonpregnant women and 2 drinks a day for men. One drink equals 12 oz of beer, 5 oz of wine, or 1 oz of hard liquor. Work with your health care provider to maintain a healthy body weight or to lose weight. Ask what an ideal weight is for you. Get at least 30 minutes of exercise that causes your heart to beat faster (aerobic exercise) most days of the week. Activities may include walking, swimming, or biking. Work with your health care provider or diet and nutrition specialist (dietitian) to adjust your eating plan to your individual calorie needs. Reading food labels  Check food labels for the amount of sodium per serving. Choose foods with less than 5 percent of the Daily Value of sodium. Generally, foods with less than 300 mg of sodium per serving fit  into this eating plan. To find whole grains, look for the word "whole" as the first word in the ingredient list. Shopping Buy products labeled as "low-sodium" or "no salt added." Buy fresh foods. Avoid canned foods and premade or frozen meals. Cooking Avoid adding salt when cooking. Use salt-free seasonings or herbs instead of table salt or sea salt. Check with your health care provider or pharmacist before using salt substitutes. Do not fry foods. Cook foods using healthy methods such as baking, boiling, grilling, and broiling instead. Cook with heart-healthy oils, such as olive, canola, soybean, or sunflower oil. Meal planning Eat a balanced diet that includes: 5 or more servings of fruits and vegetables each day. At each meal, try to fill half of your plate with fruits and vegetables. Up to 6-8 servings of whole grains each day. Less than 6 oz of lean meat, poultry, or fish each day. A 3-oz serving of meat is about the same size as a deck of cards. One egg equals 1 oz. 2 servings of low-fat dairy each day. A serving of nuts, seeds, or beans 5 times each week. Heart-healthy fats. Healthy fats called Omega-3 fatty acids are found in foods such as flaxseeds and coldwater fish, like sardines, salmon, and mackerel. Limit how much you eat of the following: Canned or prepackaged foods. Food that is high in trans fat, such as fried foods. Food that is high in saturated fat, such  as fatty meat. Sweets, desserts, sugary drinks, and other foods with added sugar. Full-fat dairy products. Do not salt foods before eating. Try to eat at least 2 vegetarian meals each week. Eat more home-cooked food and less restaurant, buffet, and fast food. When eating at a restaurant, ask that your food be prepared with less salt or no salt, if possible. What foods are recommended? The items listed may not be a complete list. Talk with your dietitian about what dietary choices are best for you. Grains Whole-grain  or whole-wheat bread. Whole-grain or whole-wheat pasta. Brown rice. Orpah Cobb. Bulgur. Whole-grain and low-sodium cereals. Pita bread. Low-fat, low-sodium crackers. Whole-wheat flour tortillas. Vegetables Fresh or frozen vegetables (raw, steamed, roasted, or grilled). Low-sodium or reduced-sodium tomato and vegetable juice. Low-sodium or reduced-sodium tomato sauce and tomato paste. Low-sodium or reduced-sodium canned vegetables. Fruits All fresh, dried, or frozen fruit. Canned fruit in natural juice (without added sugar). Meat and other protein foods Skinless chicken or [place]. Ground chicken or [place]. Pork with fat trimmed off. Fish and seafood. Egg whites. Dried beans, peas, or lentils. Unsalted nuts, nut butters, and seeds. Unsalted canned beans. Lean cuts of beef with fat trimmed off. Low-sodium, lean deli meat. Dairy Low-fat (1%) or fat-free (skim) milk. Fat-free, low-fat, or reduced-fat cheeses. Nonfat, low-sodium ricotta or cottage cheese. Low-fat or nonfat yogurt. Low-fat, low-sodium cheese. Fats and oils Soft margarine without trans fats. Vegetable oil. Low-fat, reduced-fat, or light mayonnaise and salad dressings (reduced-sodium). Canola, safflower, olive, soybean, and sunflower oils. Avocado. Seasoning and other foods Herbs. Spices. Seasoning mixes without salt. Unsalted popcorn and pretzels. Fat-free sweets. What foods are not recommended? The items listed may not be a complete list. Talk with your dietitian about what dietary choices are best for you. Grains Baked goods made with fat, such as croissants, muffins, or some breads. Dry pasta or rice meal packs. Vegetables Creamed or fried vegetables. Vegetables in a cheese sauce. Regular canned vegetables (not low-sodium or reduced-sodium). Regular canned tomato sauce and paste (not low-sodium or reduced-sodium). Regular tomato and vegetable juice (not low-sodium or reduced-sodium). Rosita Fire. Olives. Fruits Canned fruit in a  light or heavy syrup. Fried fruit. Fruit in cream or butter sauce. Meat and other protein foods Fatty cuts of meat. Ribs. Fried meat. Tomasa Blase. Sausage. Bologna and other processed lunch meats. Salami. Fatback. Hotdogs. Bratwurst. Salted nuts and seeds. Canned beans with added salt. Canned or smoked fish. Whole eggs or egg yolks. Chicken or [place] with skin. Dairy Whole or 2% milk, cream, and half-and-half. Whole or full-fat cream cheese. Whole-fat or sweetened yogurt. Full-fat cheese. Nondairy creamers. Whipped toppings. Processed cheese and cheese spreads. Fats and oils Butter. Stick margarine. Lard. Shortening. Ghee. Bacon fat. Tropical oils, such as coconut, palm kernel, or palm oil. Seasoning and other foods Salted popcorn and pretzels. Onion salt, garlic salt, seasoned salt, table salt, and sea salt. Worcestershire sauce. Tartar sauce. Barbecue sauce. Teriyaki sauce. Soy sauce, including reduced-sodium. Steak sauce. Canned and packaged gravies. Fish sauce. Oyster sauce. Cocktail sauce. Horseradish that you find on the shelf. Ketchup. Mustard. Meat flavorings and tenderizers. Bouillon cubes. Hot sauce and Tabasco sauce. Premade or packaged marinades. Premade or packaged taco seasonings. Relishes. Regular salad dressings. Where to find more information: National Heart, Lung, and Blood Institute: PopSteam.is American Heart Association: www.heart.org Summary The DASH eating plan is a healthy eating plan that has been shown to reduce high blood pressure (hypertension). It may also reduce your risk for type 2 diabetes, heart disease, and stroke. With  the DASH eating plan, you should limit salt (sodium) intake to 2,300 mg a day. If you have hypertension, you may need to reduce your sodium intake to 1,500 mg a day. When on the DASH eating plan, aim to eat more fresh fruits and vegetables, whole grains, lean proteins, low-fat dairy, and heart-healthy fats. Work with your health care provider or diet  and nutrition specialist (dietitian) to adjust your eating plan to your individual calorie needs. This information is not intended to replace advice given to you by your health care provider. Make sure you discuss any questions you have with your health care provider. Document Released: 09/13/2011 Document Revised: 09/06/2017 Document Reviewed: 09/17/2016 Elsevier Patient Education  2020 [employer].

## 2023-04-16 NOTE — Progress Notes (Deleted)
  Cardiology Office Note:  .   Date:  04/16/2023  ID:  Nicole Hall, DOB 01-Jan-1956, MRN [id number] PCP: Simone Curia, MD  [facility] Providers Cardiologist:  Meriam Sprague, MD { Click to update primary MD,subspecialty MD or APP then REFRESH:1}   History of Present Illness: .   Nicole Hall is a 67 y.o. female with hyperlipidemia here for follow-up.  She was previously seen by other cardiology providers 03/2021 with exertional dyspnea.  Calcium score 04/2021 was 0.  Echo at that time revealed LVEF 65 to 70% and no significant abnormalities.  She was admitted 06/2022 with epigastric pain and palpitations.  Telemetry was unremarkable.  Initial troponin was 82 and down trended.  Echo remained unremarkable and she was discharged home with a cardiac monitor.  Monitor 07/2022 revealed 5 episodes of SVT lasting up to 4 beats and several overnight pauses up to 4.7 seconds.  She had a sleep study that revealed minimal OSA and no treatment was recommended.  Metoprolol was discontinued.  She saw Dr. Shari Prows 09/2022 due to right ankle swelling.  She was given a short course of furosemide it was thought to be due to increased sodium intake.  ROS: ***  Studies Reviewed: Marland Kitchen       Ambulatory Monitor 07/2022:   Patch wear time was 12 days   Predominant rhythm was NSR   5 nonsustained episodes of SVT with longest lasting 4 beats   There were 9 nocturnal pauses with longest lasting 4.7s   Rare SVE (<1%), rare VE (<1%)   No sustained arrhythmias   Risk Assessment/Calculations:   {Does this patient have ATRIAL FIBRILLATION?:[id number]} No BP recorded.  {Refresh Note OR Click here to enter BP  :1}***   STOP-Bang Score:  1  { Consider Dx Sleep Disordered Breathing or Sleep Apnea  ICD G47.33          :1}    Physical Exam:   VS:  There were no vitals taken for this visit.   Wt Readings from Last 3 Encounters:  09/12/22 159 lb (72.1 kg)  08/17/22 155 lb 12.8 oz (70.7 kg)  07/17/22 157 lb 9.6 oz  (71.5 kg)    GEN: Well nourished, well developed in no acute distress NECK: No JVD; No carotid bruits CARDIAC: ***RRR, no murmurs, rubs, gallops RESPIRATORY:  Clear to auscultation without rales, wheezing or rhonchi  ABDOMEN: Soft, non-tender, non-distended EXTREMITIES:  No edema; No deformity   ASSESSMENT AND PLAN: .   ***    {Are you ordering a CV Procedure (e.g. stress test, cath, DCCV, TEE, etc)?   Press F2        :[id number]}  Dispo: ***  Signed, Chilton Si, MD

## 2023-04-17 ENCOUNTER — Telehealth (HOSPITAL_BASED_OUTPATIENT_CLINIC_OR_DEPARTMENT_OTHER): Payer: Self-pay | Admitting: *Deleted

## 2023-04-17 NOTE — Telephone Encounter (Signed)
Patient called in to say her medication list was incorrect from visit 7/9 She is actually taking Amlodipine 5 mg daily not Metoprolol  Also wanted to know if she could get foot messages from time to time  Discussed with Dr Duke Salvia, ok to get massages and continue same dose of medications  Advised patient, verbalized understanding

## 2023-05-13 ENCOUNTER — Encounter (HOSPITAL_BASED_OUTPATIENT_CLINIC_OR_DEPARTMENT_OTHER): Payer: Self-pay | Admitting: Pharmacist Clinician (PhC)/ Clinical Pharmacy Specialist

## 2023-05-13 ENCOUNTER — Ambulatory Visit (INDEPENDENT_AMBULATORY_CARE_PROVIDER_SITE_OTHER): Payer: PPO | Admitting: Pharmacist Clinician (PhC)/ Clinical Pharmacy Specialist

## 2023-05-13 VITALS — BP 153/79 | HR 72 | Ht 65.5 in | Wt 157.8 lb

## 2023-05-13 DIAGNOSIS — I10 Essential (primary) hypertension: Secondary | ICD-10-CM

## 2023-05-13 MED ORDER — VALSARTAN 80 MG PO TABS: 80.0000 mg | ORAL_TABLET | Freq: Every day | ORAL | 6 refills | Status: DC

## 2023-05-13 NOTE — Assessment & Plan Note (Addendum)
Assessment: BP is uncontrolled in office BP 153/82 mmHg;  above the goal (<130/80). Has had increased lower extremity edema in the past month Denies SOB, palpitation, chest pain, headaches Reiterated the importance of regular exercise and low salt diet   Plan:  Stop taking amlodipine Start taking valsartan 80 mg daily Patient to keep record of BP readings with heart rate and report to [place] at the next visit Patient to follow up with PharmD in 1 month  Labs ordered today:  BMET in 2 weeks

## 2023-05-13 NOTE — Patient Instructions (Signed)
Follow up appointment: Sept 12 at 11 am  Go to the lab in 2 WEEKS TO CHECK KIDNEY FUNCTION  Take your BP meds as follows:  STOP AMLODIPINE  START VALSARTAN 80 MG ONCE DAILY   Check your blood pressure at home daily (if able) and keep record of the readings.  Hypertension "High blood pressure"  Hypertension is often called "The Silent Killer." It rarely causes symptoms until it is extremely  high or has done damage to other organs in the body. For this reason, you should have your  blood pressure checked regularly by your physician. We will check your blood pressure  every time you see a provider at one of our offices.   Your blood pressure reading consists of two numbers. Ideally, blood pressure should be  below 120/80. The first ("top") number is called the systolic pressure. It measures the  pressure in your arteries as your heart beats. The second ("bottom") number is called the diastolic pressure. It measures the pressure in your arteries as the heart relaxes between beats.  The benefits of getting your blood pressure under control are enormous. A 10-point  reduction in systolic blood pressure can reduce your risk of stroke by 27% and heart failure by 28%  Your blood pressure goal is < 130/80  To check your pressure at home you will need to:  1. Sit up in a chair, with feet flat on the floor and back supported. Do not cross your ankles or legs. 2. Rest your left arm so that the cuff is about heart level. If the cuff goes on your upper arm,  then just relax the arm on the table, arm of the chair or your lap. If you have a wrist cuff, we  suggest relaxing your wrist against your chest (think of it as Pledging the Flag with the  wrong arm).  3. Place the cuff snugly around your arm, about 1 inch above the crook of your elbow. The  cords should be inside the groove of your elbow.  4. Sit quietly, with the cuff in place, for about 5 minutes. After that 5 minutes press the power   button to start a reading. 5. Do not talk or move while the reading is taking place.  6. Record your readings on a sheet of paper. Although most cuffs have a memory, it is often  easier to see a pattern developing when the numbers are all in front of you.  7. You can repeat the reading after 1-3 minutes if it is recommended  Make sure your bladder is empty and you have not had caffeine or tobacco within the last 30 min  Always bring your blood pressure log with you to your appointments. If you have not brought your monitor in to be double checked for accuracy, please bring it to your next appointment.  You can find a list of quality blood pressure cuffs at validatebp.org

## 2023-05-13 NOTE — Progress Notes (Unsigned)
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 female patient of Dr Duke Salvia, in the office today for hypertension evaluation.  She was seen by Dr. Duke Salvia at the request of Dr. Shari Prows, for elevated BP readings.  Last month her pressure was 158/72 in the office and there was some confusion about her medications.  Chart noted her to be taking metoprolol tartrate once daily, but a day later it was corrected, in that she was actually taking amlodipine 5 mg, not metoprolol.  She consented to be in the [employer] and was randomized to group 1.  Today she returns for follow up.  She doesn't think the home meter is working correctly, as the numbers are varying some.  She also thinks there is some measure of white coat hypertension that is showing in her office readings.   She has continued to have swelling in both feet, right worse than left, and is scheduled to see [facility] next month.  Today she states that the swelling has continued to worsen over time and now is present most of the day.    Blood Pressure Goal:  130/80  Current Medications:  amlodipine 5 mg every day,   Family Hx:   adopted - about 5 years ago was able to find her birth family.  Mother living at 67, father never went to [place], 2 sisters healthy - 1 with DM; 3 sons healthy, active thin  Social Hx:      Tobacco: no  Alcohol: wine in the evenings  Diet:    eats mostly home cooked, mostly chicken, rarely beef; fresh vegetables and fruit,  some cookies or banana breads   Home BP readings:  home readings from [facility] device - read within 5 points of office reading AM -  27 readings average 122/65  HR 70 (range 103-147/53-75) PM -  24 readings average 133/70  HR 72 (range 114-141/64-74)  Accessory Clinical Findings    Lab Results  Component Value Date   CREATININE 0.80 09/20/2022   BUN 12 09/20/2022   NA 140 09/20/2022   K 4.3 09/20/2022   CL 100 09/20/2022   CO2 25 09/20/2022   Lab Results  Component Value Date   ALT 19 06/21/2022   AST 22 06/21/2022   ALKPHOS 58 06/21/2022   BILITOT 0.8 06/21/2022   Lab Results  Component Value Date   HGBA1C 6.2 (H) 06/21/2022    Home Medications    Current Outpatient Medications  Medication Sig Dispense Refill   amLODipine (NORVASC) 5 MG tablet Take 5 mg by mouth daily.     Cholecalciferol (VITAMIN D) 50 MCG (2000 UT) CAPS Take 1 capsule by mouth daily.     rosuvastatin (CRESTOR) 20 MG tablet Take 1 tablet (20 mg total) by mouth daily. 90 tablet 3   valsartan (DIOVAN) 80 MG tablet Take 1 tablet (80 mg total) by mouth daily. 30 tablet 6  No current facility-administered medications for this visit.     HYPERTENSION CONTROL Vitals:   05/13/23 1319 05/13/23 1325  BP: (!) 153/82 (!) 153/79    The patient's blood pressure is elevated above target today. {Click here if intervention needs to be changed Refresh Note :1}  In order to address the patient's elevated BP: A new medication was prescribed today.; A current anti-hypertensive medication was adjusted today.; Blood pressure will be monitored at home to determine if medication changes need to be made.      Assessment & Plan    Hypertension Assessment: BP is uncontrolled in office BP 153/82 mmHg;  above the goal (<130/80). Has had increased lower extremity edema in the past month Denies SOB, palpitation, chest pain, headaches Reiterated the importance of regular exercise and low salt diet   Plan:   Stop taking amlodipine Start taking valsartan 80 mg daily Patient to keep record of BP readings with heart rate and report to [place] at the next visit Patient to follow up with PharmD in 1 month  Labs ordered today:  BMET in 2 weeks   Phillips Hay PharmD CPP [facility]  [address] [place], [place] [address] [phone]

## 2023-05-28 ENCOUNTER — Ambulatory Visit (HOSPITAL_BASED_OUTPATIENT_CLINIC_OR_DEPARTMENT_OTHER): Payer: PPO

## 2023-05-30 LAB — BASIC METABOLIC PANEL
CO2: 23 mmol/L (ref 20–29)
Calcium: 9.7 mg/dL (ref 8.7–10.3)

## 2023-06-03 ENCOUNTER — Telehealth (HOSPITAL_BASED_OUTPATIENT_CLINIC_OR_DEPARTMENT_OTHER): Payer: Self-pay | Admitting: *Deleted

## 2023-06-03 DIAGNOSIS — I10 Essential (primary) hypertension: Secondary | ICD-10-CM

## 2023-06-03 DIAGNOSIS — Z5181 Encounter for therapeutic drug level monitoring: Secondary | ICD-10-CM

## 2023-06-03 NOTE — Telephone Encounter (Signed)
-----   Message from [facility] sent at 06/01/2023  6:45 PM EDT ----- Potassium is a little high.  Reduce valsartan to 40mg .  Repeat BMP in a week.

## 2023-06-03 NOTE — Telephone Encounter (Signed)
Patient reviewed comments in mychart and left detailed message Mailed lab orders

## 2023-06-13 ENCOUNTER — Other Ambulatory Visit: Payer: Self-pay | Admitting: *Deleted

## 2023-06-13 DIAGNOSIS — R600 Localized edema: Secondary | ICD-10-CM

## 2023-06-18 LAB — BASIC METABOLIC PANEL
BUN/Creatinine Ratio: 24 (ref 12–28)
BUN: 21 mg/dL (ref 8–27)
CO2: 24 mmol/L (ref 20–29)
Calcium: 9.4 mg/dL (ref 8.7–10.3)
Chloride: 101 mmol/L (ref 96–106)
Creatinine, Ser: 0.89 mg/dL (ref 0.57–1.00)
Glucose: 113 mg/dL — ABNORMAL HIGH (ref 70–99)
Potassium: 4.8 mmol/L (ref 3.5–5.2)
Sodium: 140 mmol/L (ref 134–144)
eGFR: 70 mL/min/{1.73_m2} (ref >59)

## 2023-06-20 ENCOUNTER — Ambulatory Visit (INDEPENDENT_AMBULATORY_CARE_PROVIDER_SITE_OTHER): Payer: PPO | Admitting: Pharmacist Clinician (PhC)/ Clinical Pharmacy Specialist

## 2023-06-20 ENCOUNTER — Ambulatory Visit (HOSPITAL_COMMUNITY)
Admission: RE | Admit: 2023-06-20 | Discharge: 2023-06-20 | Disposition: A | Discharge status: Home / Self Care | Payer: PPO | Source: Ambulatory Visit | Attending: Vascular Surgery | Admitting: Vascular Surgery

## 2023-06-20 ENCOUNTER — Encounter (HOSPITAL_BASED_OUTPATIENT_CLINIC_OR_DEPARTMENT_OTHER): Payer: Self-pay | Admitting: Pharmacist Clinician (PhC)/ Clinical Pharmacy Specialist

## 2023-06-20 ENCOUNTER — Ambulatory Visit: Payer: PPO | Admitting: Physician Assistant

## 2023-06-20 VITALS — BP 156/79

## 2023-06-20 VITALS — BP 162/92 | HR 65 | Temp 98.0°F | Resp 18 | Ht 65.5 in | Wt 159.0 lb

## 2023-06-20 DIAGNOSIS — I10 Essential (primary) hypertension: Secondary | ICD-10-CM | POA: Diagnosis not present

## 2023-06-20 DIAGNOSIS — R600 Localized edema: Secondary | ICD-10-CM | POA: Insufficient documentation

## 2023-06-20 DIAGNOSIS — I872 Venous insufficiency (chronic) (peripheral): Secondary | ICD-10-CM

## 2023-06-20 DIAGNOSIS — I8393 Asymptomatic varicose veins of bilateral lower extremities: Secondary | ICD-10-CM

## 2023-06-20 MED ORDER — VALSARTAN 40 MG PO TABS: 40.0000 mg | ORAL_TABLET | Freq: Every day | ORAL | 3 refills | Status: DC

## 2023-06-20 NOTE — Progress Notes (Signed)
[facility] [place]   Reason for referral: Swollen right > left  leg/foot  History of Present Illness  Nicole Hall is a 67 y.o. female who presents with chief complaint: swollen leg.  Patient notes, onset of swelling 6 months ago, associated with prolonged time on her feet.  The patient has had no history of DVT, no history of varicose vein, no history of venous stasis ulcers, no history of  Lymphedema and no history of skin changes in lower legs.  There is a family history of venous disorders.  The patient has  used compression stockings in the past.   She states she has always had visible veins/spider veins since her first pregnancy.  She had increased swelling that has occurred for the past 6 months that causes her right foot swelling into her toes with bluish skin changes.      She has worn knee high compression OTC.  She is on her feet all day and stays busy around her home. She denies weeping or the skin, non healing wounds, or Chronic skin changes.  She states her edema is better first thing in the AM and as the day progresses she has swelling.  She elevates at night with her adjustable bed.     She denies symptoms of CLI no rest pain or claudication.  She told me she was adopted and found her birth mother a few years ago and she has LE edema as well.    Past Medical History:  Diagnosis Date   Lower extremity edema 04/16/2023   Superficial basal cell carcinoma (BCC) 08/15/2021   Right Breast (curet and 5Fu)    Past Surgical History:  Procedure Laterality Date   KNEE SURGERY Left     Social History   Socioeconomic History   Marital status: Married    Spouse name: Not on file   Number of children: Not on file   Years of education: Not on file   Highest education level: Not on file  Occupational History   Not on file  Tobacco Use   Smoking status: Never    Passive exposure: Never   Smokeless tobacco: Never  Vaping Use   Vaping status: Never Used   Substance and Sexual Activity   Alcohol use: Yes    Alcohol/week: 7.0 standard drinks of alcohol    Types: 7 Glasses of wine per week   Drug use: No   Sexual activity: Yes    Partners: Male    Birth control/protection: Surgical    Comment: 1ST INTERCOURSE- 16 , PARTNERS- 3  Other Topics Concern   Not on file  Social History Narrative   Not on file   Social Determinants of Health   Financial Resource Strain: Not on file  Food Insecurity: No Food Insecurity (06/21/2022)   Hunger Vital Sign    Worried About Running Out of Food in the Last Year: Never true    Ran Out of Food in the Last Year: Never true  Transportation Needs: No Transportation Needs (06/21/2022)   PRAPARE - Administrator, Civil Service (Medical): No    Lack of Transportation (Non-Medical): No  Physical Activity: Not on file  Stress: Not on file  Social Connections: Not on file  Intimate Partner Violence: Not At Risk (06/21/2022)   Humiliation, Afraid, Rape, and Kick questionnaire    Fear of Current or Ex-Partner: No    Emotionally Abused: No    Physically Abused: No  Sexually Abused: No    Family History  Adopted: Yes  Problem Relation Age of Onset   Colon cancer Maternal Aunt    Colon cancer Maternal Uncle    Ovarian cancer Maternal Grandmother 66 - 49       possibly uterine    Current Outpatient Medications on File Prior to Visit  Medication Sig Dispense Refill   Cholecalciferol (VITAMIN D) 50 MCG (2000 UT) CAPS Take 1 capsule by mouth daily.     rosuvastatin (CRESTOR) 20 MG tablet Take 1 tablet (20 mg total) by mouth daily. (Patient taking differently: Take 20 mg by mouth daily. Takes MWF) 90 tablet 3   No current facility-administered medications on file prior to visit.    Allergies as of 06/20/2023   (No Known Allergies)     ROS:   General:  No weight loss, Fever, chills  HEENT: No recent headaches, no nasal bleeding, no visual changes, no sore throat  Neurologic: No  dizziness, blackouts, seizures. No recent symptoms of stroke or mini- stroke. No recent episodes of slurred speech, or temporary blindness.  Cardiac: No recent episodes of chest pain/pressure, no shortness of breath at rest.  No shortness of breath with exertion.  Denies history of atrial fibrillation or irregular heartbeat  Vascular: No history of rest pain in feet.  No history of claudication.  No history of non-healing ulcer, No history of DVT   Pulmonary: No home oxygen, no productive cough, no hemoptysis,  No asthma or wheezing  Musculoskeletal:  [ ]  Arthritis, [ ]  Low back pain,  [ ]  Joint pain  Hematologic:No history of hypercoagulable state.  No history of easy bleeding.  No history of anemia  Gastrointestinal: No hematochezia or melena,  No gastroesophageal reflux, no trouble swallowing  Urinary: [ ]  chronic Kidney disease, [ ]  on HD - [ ]  MWF or [ ]  TTHS, [ ]  Burning with urination, [ ]  Frequent urination, [ ]  Difficulty urinating;   Skin: No rashes  Psychological: No history of anxiety,  No history of depression  Physical Examination  Vitals:   06/20/23 1258  BP: (!) 162/92  Pulse: 65  Resp: 18  Temp: 98 F (36.7 C)  TempSrc: Temporal  SpO2: 98%  Weight: 159 lb (72.1 kg)  Height: 5' 5.5" (1.664 m)    Body mass index is 26.06 kg/m.  General:  Alert and oriented, no acute distress HEENT: Normal Neck: No bruit or JVD Pulmonary: Clear to auscultation bilaterally Cardiac: Regular Rate and Rhythm without murmur Abdomen: Soft, non-tender, non-distended, no mass, no scars Skin: No rash, no erythema or signs of cellulitis or infection.   Visible telangectasia. Extremity Pulses:  radial,  femoral, dorsalis pedis, posterior tibial pulses bilaterally Musculoskeletal: No deformity minimal/mild right LE/foot edema  Neurologic: Upper and lower extremity motor 5/5 and symmetric  DATA: Venous Reflux Times   +--------------+---------+------+-----------+------------+--------+  RIGHT        Reflux NoRefluxReflux TimeDiameter cmsComments                          Yes                                   +--------------+---------+------+-----------+------------+--------+  CFV                    yes   >1 second                       +--------------+---------+------+-----------+------------+--------+  FV mid        no                                              +--------------+---------+------+-----------+------------+--------+  Popliteal    no                                              +--------------+---------+------+-----------+------------+--------+  GSV at SFJ              yes    >500 ms      .683              +--------------+---------+------+-----------+------------+--------+  GSV prox thigh          yes    >500 ms      .555              +--------------+---------+------+-----------+------------+--------+  GSV mid thigh           yes    >500 ms      .500              +--------------+---------+------+-----------+------------+--------+  GSV dist thigh          yes    >500 ms      .489              +--------------+---------+------+-----------+------------+--------+  GSV at knee             yes    >500 ms      .396              +--------------+---------+------+-----------+------------+--------+  GSV prox calf           yes    >500 ms      .316              +--------------+---------+------+-----------+------------+--------+  SSV Pop Fossa no                            .260              +--------------+---------+------+-----------+------------+--------+  SSV prox calf no                            .228              +--------------+---------+------+-----------+------------+--------+         Summary:  Right:  - No evidence of deep vein thrombosis seen in the right lower extremity,  from the common femoral  through the popliteal veins.  - No evidence of superficial venous thrombosis in the right lower  extremity.  - No evidence of superficial venous reflux seen in the right short  saphenous vein.  - Venous reflux is noted in the right common femoral vein.  - Venous reflux is noted in the right sapheno-femoral junction.  - Venous reflux is noted in the right greater saphenous vein in the thigh.  - Venous reflux is noted in the right greater saphenous vein in the calf.   Assessment/Plan: Venous reflux mixed with lymphedema on the right LE - Venous reflux is noted in the right common femoral vein.  - Venous reflux is noted in the right sapheno-femoral junction.  - Venous  reflux is noted in the right greater saphenous vein in the thigh.  - Venous reflux is noted in the right greater saphenous vein in the calf.  She has venous reflux in the right LE evident by duplex.    She has re occurring right LE edema in to the foot dorsum daily.  When she wears OTC compression the edema is controlled.  She has no evidence of skin changes due to chronic venous insufficiency.  She has palpable pedal pulses and is not at risk of limb loss.   She will be fitted with thigh hight 20-30 mm hg compression to be worn daily, elevation and continued daily activity to include walking.  She was given a vein handout.  If she develops new symptoms of venous insufficieny she will call our office for a follow up.     Mosetta Pigeon PA-C [facility] [place] Office: [phone]  MD in clinic [place]

## 2023-06-20 NOTE — Progress Notes (Signed)
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 female patient of Dr Duke Salvia, in the office today for hypertension evaluation.  She was seen by Dr. Duke Salvia at the request of Dr. Shari Prows, for elevated BP readings.  Last month her pressure was 158/72 in the office and there was some confusion about her medications.  Chart noted her to be taking metoprolol tartrate once daily, but a day later it was corrected, in that she was actually taking amlodipine 5 mg, not metoprolol.  She consented to be in the [employer] and was randomized to group 1.  When I saw her for the first follow up visit, she was having ongoing swelling in both feet.  We stopped the amlodipine and gave her valsartan 80 mg.  Unfortunately her potassium went up to 5.6 so the valsartan was decreased to 40 mg.    Today she returns for follow up. Has done well with the lower dose of valsartan, but still admits she'd rather not take anything at all.  We had a long discussion on pros and cons of stopping the medication as well as side effects   Blood Pressure Goal:  130/80  Current Medications:  amlodipine 5 mg every day, valsartan 40 mg qd  Family Hx:   adopted - about 5 years ago was able to find her birth family.  Mother living at 60, father never went to [place], 2 sisters healthy - 1 with DM; 3 sons healthy, active thin  Social Hx:      Tobacco: no  Alcohol: wine in the evenings  Diet:    eats mostly home cooked, mostly chicken, rarely beef; fresh  vegetables and fruit, some cookies or banana breads   Home BP readings:  home readings from [employer] device - read within 5 points of office reading AM -  30 readings average 117/65  HR 72  previous average 122/65  PM -  19 readings average 121/67  HR 76  previous average 133/70  Of the 49 readings only 4 > 130 and all diastolic WNL  Accessory Clinical Findings    Lab Results  Component Value Date   CREATININE 0.89 06/17/2023   BUN 21 06/17/2023   NA 140 06/17/2023   K 4.8 06/17/2023   CL 101 06/17/2023   CO2 24 06/17/2023   Lab Results  Component Value Date   ALT 19 06/21/2022   AST 22 06/21/2022   ALKPHOS 58 06/21/2022   BILITOT 0.8 06/21/2022   Lab Results  Component Value Date   HGBA1C 6.2 (H) 06/21/2022    Home Medications    Current Outpatient Medications  Medication Sig Dispense Refill   Cholecalciferol (VITAMIN D) 50 MCG (2000 UT) CAPS Take 1 capsule by mouth daily.     rosuvastatin (CRESTOR) 20 MG tablet Take 1 tablet (20 mg total) by mouth daily. (Patient taking differently: Take 20 mg by mouth daily. Takes MWF) 90 tablet 3   valsartan (DIOVAN) 40  MG tablet Take 1 tablet (40 mg total) by mouth daily. 90 tablet 3   No current facility-administered medications for this visit.     156/79  Assessment & Plan    Hypertension Assessment: BP is uncontrolled in office BP 146/88 mmHg;  above the goal (<130/80). Home ([employer]) cuff read within 10 points of office reading today. Home average shows BP to be well controlled Tolerates valsartan 40 mg well without any side effects Denies SOB, palpitation, chest pain, headaches,or swelling Reiterated the importance of regular exercise and low salt diet   Plan:  Continue taking valsartan 40 mg daily Patient to keep record of BP readings with heart rate and report to [place] at the next visit Patient to follow up with PharmD in 2 months  Labs ordered today: none    Phillips Hay PharmD CPP [facility]  [address] [place], [place] [address] [phone]

## 2023-06-20 NOTE — Patient Instructions (Signed)
  Take your BP meds as follows:  Continue with valsartan 40 mg once daily  Check your blood pressure at home daily (if able) and keep record of the readings.  Hypertension "High blood pressure"  Hypertension is often called "The Silent Killer." It rarely causes symptoms until it is extremely  high or has done damage to other organs in the body. For this reason, you should have your  blood pressure checked regularly by your physician. We will check your blood pressure  every time you see a provider at one of our offices.   Your blood pressure reading consists of two numbers. Ideally, blood pressure should be  below 120/80. The first ("top") number is called the systolic pressure. It measures the  pressure in your arteries as your heart beats. The second ("bottom") number is called the diastolic pressure. It measures the pressure in your arteries as the heart relaxes between beats.  The benefits of getting your blood pressure under control are enormous. A 10-point  reduction in systolic blood pressure can reduce your risk of stroke by 27% and heart failure by 28%  Your blood pressure goal is <130/80  To check your pressure at home you will need to:  1. Sit up in a chair, with feet flat on the floor and back supported. Do not cross your ankles or legs. 2. Rest your left arm so that the cuff is about heart level. If the cuff goes on your upper arm,  then just relax the arm on the table, arm of the chair or your lap. If you have a wrist cuff, we  suggest relaxing your wrist against your chest (think of it as Pledging the Flag with the  wrong arm).  3. Place the cuff snugly around your arm, about 1 inch above the crook of your elbow. The  cords should be inside the groove of your elbow.  4. Sit quietly, with the cuff in place, for about 5 minutes. After that 5 minutes press the power  button to start a reading. 5. Do not talk or move while the reading is taking place.  6. Record your  readings on a sheet of paper. Although most cuffs have a memory, it is often  easier to see a pattern developing when the numbers are all in front of you.  7. You can repeat the reading after 1-3 minutes if it is recommended  Make sure your bladder is empty and you have not had caffeine or tobacco within the last 30 min  Always bring your blood pressure log with you to your appointments. If you have not brought your monitor in to be double checked for accuracy, please bring it to your next appointment.  You can find a list of quality blood pressure cuffs at validatebp.org

## 2023-06-20 NOTE — Assessment & Plan Note (Signed)
Assessment: BP is uncontrolled in office BP 146/88 mmHg;  above the goal (<130/80). Home ([employer]) cuff read within 10 points of office reading today. Home average shows BP to be well controlled Tolerates valsartan 40 mg well without any side effects Denies SOB, palpitation, chest pain, headaches,or swelling Reiterated the importance of regular exercise and low salt diet   Plan:  Continue taking valsartan 40 mg daily Patient to keep record of BP readings with heart rate and report to [place] at the next visit Patient to follow up with PharmD in 2 months  Labs ordered today: none

## 2023-06-21 ENCOUNTER — Encounter: Payer: Self-pay | Admitting: Physician Assistant

## 2023-07-26 ENCOUNTER — Other Ambulatory Visit (HOSPITAL_BASED_OUTPATIENT_CLINIC_OR_DEPARTMENT_OTHER): Payer: Self-pay

## 2023-07-26 MED ORDER — FLUAD 0.5 ML IM SUSY
0.5000 mL | PREFILLED_SYRINGE | Freq: Once | INTRAMUSCULAR | 0 refills | Status: AC
  Filled 2023-07-26: qty 0.5, 1d supply, fill #0
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
## 2023-07-30 ENCOUNTER — Encounter (HOSPITAL_BASED_OUTPATIENT_CLINIC_OR_DEPARTMENT_OTHER): Payer: Self-pay | Admitting: Pharmacist Clinician (PhC)/ Clinical Pharmacy Specialist

## 2023-07-30 ENCOUNTER — Ambulatory Visit (HOSPITAL_BASED_OUTPATIENT_CLINIC_OR_DEPARTMENT_OTHER): Payer: PPO | Admitting: Pharmacist Clinician (PhC)/ Clinical Pharmacy Specialist

## 2023-07-30 VITALS — BP 130/76 | HR 66

## 2023-07-30 DIAGNOSIS — E7800 Pure hypercholesterolemia, unspecified: Secondary | ICD-10-CM | POA: Diagnosis not present

## 2023-07-30 DIAGNOSIS — I10 Essential (primary) hypertension: Secondary | ICD-10-CM

## 2023-07-30 NOTE — Patient Instructions (Signed)
Follow up appointment: December 4 at 2:30 pm with Dr. Duke Salvia Please bring the blood pressure cuff to this appointment  Go to the lab in a few days prior to your appointment with Dr. Duke Salvia  Take your BP meds as follows: continue valsartan 40 mg daily  Check your blood pressure at home daily (if able) and keep record of the readings.  Hypertension "High blood pressure"  Hypertension is often called "The Silent Killer." It rarely causes symptoms until it is extremely  high or has done damage to other organs in the body. For this reason, you should have your  blood pressure checked regularly by your physician. We will check your blood pressure  every time you see a provider at one of our offices.   Your blood pressure reading consists of two numbers. Ideally, blood pressure should be  below 120/80. The first ("top") number is called the systolic pressure. It measures the  pressure in your arteries as your heart beats. The second ("bottom") number is called the diastolic pressure. It measures the pressure in your arteries as the heart relaxes between beats.  The benefits of getting your blood pressure under control are enormous. A 10-point  reduction in systolic blood pressure can reduce your risk of stroke by 27% and heart failure by 28%  Your blood pressure goal is < 130/80  To check your pressure at home you will need to:  1. Sit up in a chair, with feet flat on the floor and back supported. Do not cross your ankles or legs. 2. Rest your left arm so that the cuff is about heart level. If the cuff goes on your upper arm,  then just relax the arm on the table, arm of the chair or your lap. If you have a wrist cuff, we  suggest relaxing your wrist against your chest (think of it as Pledging the Flag with the  wrong arm).  3. Place the cuff snugly around your arm, about 1 inch above the crook of your elbow. The  cords should be inside the groove of your elbow.  4. Sit quietly,  with the cuff in place, for about 5 minutes. After that 5 minutes press the power  button to start a reading. 5. Do not talk or move while the reading is taking place.  6. Record your readings on a sheet of paper. Although most cuffs have a memory, it is often  easier to see a pattern developing when the numbers are all in front of you.  7. You can repeat the reading after 1-3 minutes if it is recommended  Make sure your bladder is empty and you have not had caffeine or tobacco within the last 30 min  Always bring your blood pressure log with you to your appointments. If you have not brought your monitor in to be double checked for accuracy, please bring it to your next appointment.  You can find a list of quality blood pressure cuffs at validatebp.org

## 2023-07-30 NOTE — Assessment & Plan Note (Signed)
Assessment: BP is controlled in office BP 130/76  mmHg;  above the goal (<130/80). Tolerates valsartan well without any side effects Denies SOB, palpitation, chest pain, headaches,or swelling Reiterated the importance of regular exercise and low salt diet   Plan:  Continue taking valsartan 40 mg daily Patient to keep record of BP readings with heart rate and report to [place] at the next visit Patient to follow up with Dr. Duke Salvia in 1 months - Final vivify appointment  Labs ordered today:  Lipid panel (pt decreased rosuvastatin dose last month, will draw in early Dec)

## 2023-09-11 ENCOUNTER — Telehealth (HOSPITAL_BASED_OUTPATIENT_CLINIC_OR_DEPARTMENT_OTHER): Payer: Self-pay | Admitting: *Deleted

## 2023-09-11 ENCOUNTER — Encounter (HOSPITAL_BASED_OUTPATIENT_CLINIC_OR_DEPARTMENT_OTHER): Payer: Self-pay | Admitting: Cardiovascular Disease

## 2023-09-11 ENCOUNTER — Ambulatory Visit (HOSPITAL_BASED_OUTPATIENT_CLINIC_OR_DEPARTMENT_OTHER): Payer: PPO | Admitting: Cardiovascular Disease

## 2023-09-11 VITALS — BP 136/70 | HR 75 | Ht 65.5 in | Wt 157.9 lb

## 2023-09-11 DIAGNOSIS — I161 Hypertensive emergency: Secondary | ICD-10-CM | POA: Diagnosis not present

## 2023-09-11 DIAGNOSIS — Z09 Encounter for follow-up examination after completed treatment for conditions other than malignant neoplasm: Secondary | ICD-10-CM

## 2023-09-11 DIAGNOSIS — E7800 Pure hypercholesterolemia, unspecified: Secondary | ICD-10-CM

## 2023-09-11 DIAGNOSIS — Z5181 Encounter for therapeutic drug level monitoring: Secondary | ICD-10-CM

## 2023-09-11 NOTE — Patient Instructions (Addendum)
Medication Instructions:  HOLD YOUR ROSUVASTATIN FOR ANOTHER WEEK IF IMPROVED TRY REDUCING TO 10 MG DAILY (1/2 OF THE 20 MG).   Labwork: FASTING LP/CMET IN 3 MONTHS ABOUT 1 WEEK PRIOR TO FOLLOW UP   Testing/Procedures: NONE  Follow-Up: 3 MONTHS WITH CAITLIN W NP IN [facility]   If you need a refill on your cardiac medications before your next appointment, please call your pharmacy.

## 2023-09-11 NOTE — Telephone Encounter (Signed)
Patient returned study group 1 blood pressure machine

## 2023-09-11 NOTE — Progress Notes (Signed)
Cardiology Office Note:  .    Date:  09/11/2023  ID:  ALINNE GRAYBILL, DOB January 23, 1956, MRN [id number] PCP: Simone Curia, MD  [facility] Providers Cardiologist:  Meriam Sprague, MD (Inactive)     History of Present Illness: .    FABIENNE DOMBROSKI is a 67 y.o. female with hyperlipidemia, here for follow-up. She was previously seen by other cardiology providers 03/2021 with exertional dyspnea. Calcium score 04/2021 was 0. Echo at that time revealed LVEF 65 to 70% and no significant abnormalities. She was admitted 06/2022 with epigastric pain and palpitations. Telemetry was unremarkable. Initial troponin was 82 and down trended. Echo remained unremarkable and she was discharged home with a cardiac monitor. Monitor 07/2022 revealed 5 episodes of SVT lasting up to 4 beats and several overnight pauses up to 4.7 seconds. She had a sleep study that revealed minimal OSA and no treatment was recommended. Metoprolol was discontinued. She saw Dr. Shari Prows 09/2022 due to right ankle swelling. She was given a short course of furosemide. It was thought to be due to increased sodium intake.   At her visit 04/2023 she was doing well.  She noted some swelling at the end of the day.  She wasn't using her compression socks due to the weather.  BP was high in the office but controlled at home. She saw Phillips Hay, PharmD and amlodipine was switched to valsartan due to edema. Potassium increased so valsartan was reduced to 40mg . BP was controlled at follow up 07/2023.   Ms. Werne presents for a routine follow-up. She reports feeling generally well, with no specific concerns or symptoms. She maintains an active lifestyle, achieving close to 10,000 steps daily. However, she expresses dissatisfaction with her current cholesterol medication, citing concerns about potential side effects, particularly memory fog and word-finding difficulties. She reports having stopped the medication for a week due to the holiday season  and expresses a desire to discontinue it permanently or reduce the dosage.  She also mentions a history of venous insufficiency, which has led to persistent ankle swelling. Despite this, she denies any issues with breathing or chest pressure. She has been compliant with her blood pressure medication and wears compression socks daily to manage the swelling.   The patient's dissatisfaction with her cholesterol medication appears to stem from a perceived cognitive decline, which she attributes to the medication. She reports instances of struggling to find words, which she describes as uncharacteristic of her usual cognitive function. She expresses a desire to explore alternatives to her current cholesterol management, including reducing the frequency of medication intake or switching to a different statin. She also expresses a willingness to monitor her cholesterol levels and adjust her management plan accordingly.      ROS:  Please see the history of present illness. All other systems are reviewed and negative.  (+) Bilateral LE swelling, R>L, with some associated pain  Studies Reviewed: Marland Kitchen   EKG Interpretation Date/Time:  Wednesday September 11 2023 14:38:07 EST Ventricular Rate:  72 PR Interval:  180 QRS Duration:  88 QT Interval:  390 QTC Calculation: 427 R Axis:   73  Text Interpretation: Normal sinus rhythm Normal ECG When compared with ECG of 20-Jun-2022 10:20, No significant change was found Confirmed by Chilton Si ([id number]) on 09/11/2023 2:42:49 PM    Risk Assessment/Calculations:          Physical Exam:    VS:  BP 136/70   Pulse 75   Ht 5' 5.5" (1.664 m)  Wt 157 lb 14.4 oz (71.6 kg)   SpO2 97%   BMI 25.88 kg/m  , BMI Body mass index is 25.88 kg/m. GENERAL:  Well appearing HEENT: Pupils equal round and reactive, fundi not visualized, oral mucosa unremarkable NECK:  No jugular venous distention, waveform within normal limits, carotid upstroke brisk and symmetric, no  bruits, no thyromegaly LUNGS:  Clear to auscultation bilaterally HEART:  RRR.  PMI not displaced or sustained,S1 and S2 within normal limits, no S3, no S4, no clicks, no rubs, no murmurs ABD:  Flat, positive bowel sounds normal in frequency in pitch, no bruits, no rebound, no guarding, no midline pulsatile mass, no hepatomegaly, no splenomegaly EXT:  2 plus pulses throughout, + LE edema with venous stasis changes R>L, no cyanosis no clubbing SKIN:  No rashes no nodules NEURO:  Cranial nerves II through XII grossly intact, motor grossly intact throughout PSYCH:  Cognitively intact, oriented to person place and time  Wt Readings from Last 3 Encounters:  09/11/23 157 lb 14.4 oz (71.6 kg)  06/20/23 159 lb (72.1 kg)  05/13/23 157 lb 12.8 oz (71.6 kg)     ASSESSMENT AND PLAN: .    # Hyperlipidemia Patient has concerns about taking cholesterol medication due to potential cognitive side effects. Discussed the benefits of statin therapy in reducing cardiovascular risk, but also acknowledged patient's concerns. Patient's calcium score in 2022 was zero, indicating low cardiovascular risk.  Lp(a) elevated at 77.  Prior to starting rosuvastatin her LDL was 162. -Stop Rosuvastatin for two weeks to assess for any improvement in cognitive symptoms. -If no improvement, restart Rosuvastatin at a lower dose of 10mg  daily. -Check lipid panel in three months (around March 2025) to assess response to lower dose.  # Hypertension Blood pressure slightly elevated in office, but patient reports lower readings at home. -Advise patient to monitor blood pressure at home, aiming for readings under 130/80. -Recheck blood pressure readings in one week. -Continue valsartan  # Venous Insufficiency Patient reports swelling in ankles and has been diagnosed with venous insufficiency. -Continue use of compression socks.  General Health Maintenance -Repeat calcium score in 2027 to assess for development of plaque.          Dispo:  FU with Emiko Osorto C. Duke Salvia, MD, [facility] in 4 months.    Signed, Chilton Si, MD

## 2023-11-09 ENCOUNTER — Other Ambulatory Visit (HOSPITAL_BASED_OUTPATIENT_CLINIC_OR_DEPARTMENT_OTHER): Payer: Self-pay | Admitting: Cardiovascular Disease

## 2023-12-13 ENCOUNTER — Ambulatory Visit (HOSPITAL_BASED_OUTPATIENT_CLINIC_OR_DEPARTMENT_OTHER): Payer: PPO | Admitting: Cardiovascular Disease

## 2023-12-13 VITALS — BP 128/64 | HR 81 | Ht 65.5 in | Wt 159.9 lb

## 2023-12-13 DIAGNOSIS — E7800 Pure hypercholesterolemia, unspecified: Secondary | ICD-10-CM | POA: Diagnosis not present

## 2023-12-13 DIAGNOSIS — I10 Essential (primary) hypertension: Secondary | ICD-10-CM | POA: Diagnosis not present

## 2023-12-13 DIAGNOSIS — Z131 Encounter for screening for diabetes mellitus: Secondary | ICD-10-CM

## 2023-12-13 LAB — COMPREHENSIVE METABOLIC PANEL
ALT: 18 IU/L (ref 0–32)
AST: 21 IU/L (ref 0–40)
Albumin: 4.6 g/dL (ref 3.9–4.9)
Alkaline Phosphatase: 87 IU/L (ref 44–121)
BUN/Creatinine Ratio: 23 (ref 12–28)
BUN: 19 mg/dL (ref 8–27)
Bilirubin Total: 0.5 mg/dL (ref 0.0–1.2)
CO2: 24 mmol/L (ref 20–29)
Calcium: 9.7 mg/dL (ref 8.7–10.3)
Chloride: 103 mmol/L (ref 96–106)
Creatinine, Ser: 0.82 mg/dL (ref 0.57–1.00)
Globulin, Total: 2.3 g/dL (ref 1.5–4.5)
Glucose: 89 mg/dL (ref 70–99)
Potassium: 5.2 mmol/L (ref 3.5–5.2)
Sodium: 142 mmol/L (ref 134–144)
Total Protein: 6.9 g/dL (ref 6.0–8.5)
eGFR: 77 mL/min/{1.73_m2} (ref >59)

## 2023-12-13 LAB — LIPID PANEL
Chol/HDL Ratio: 2.7 ratio (ref 0.0–4.4)
Cholesterol, Total: 206 mg/dL — ABNORMAL HIGH (ref 100–199)
HDL: 77 mg/dL (ref >39)
LDL Chol Calc (NIH): 107 mg/dL — ABNORMAL HIGH (ref 0–99)
Triglycerides: 127 mg/dL (ref 0–149)
VLDL Cholesterol Cal: 22 mg/dL (ref 5–40)

## 2023-12-13 NOTE — Patient Instructions (Addendum)
 Medication Instructions:  Your physician recommends that you continue on your current medications as directed. Please refer to the Current Medication list given to you today.   Labwork: FASTING LP/CMET/A1C ABOUT 1 WEEK PRIOR TO FOLLOW UP   Testing/Procedures: NONE  Follow-Up: 1 YEAR WITH DR [place] OR CAITLIN W NP IN [facility]   If you need a refill on your cardiac medications before your next appointment, please call your pharmacy.

## 2023-12-13 NOTE — Progress Notes (Signed)
 Cardiology Office Note:  .    Date:  12/15/2023  ID:  Nicole Hall, DOB 1956/03/04, MRN [id number] PCP: Simone Curia, MD  [facility] Providers Cardiologist:  Meriam Sprague, MD (Inactive)     History of Present Illness: .    Nicole Hall is a 68 y.o. female with hypertension, and hyperlipidemia here for follow-up. She was previously seen by other cardiology providers 03/2021 with exertional dyspnea. Calcium score 04/2021 was 0. Echo at that time revealed LVEF 65 to 70% and no significant abnormalities. She was admitted 06/2022 with epigastric pain and palpitations. Telemetry was unremarkable. Initial troponin was 82 and down trended. Echo remained unremarkable and she was discharged home with a cardiac monitor. Monitor 07/2022 revealed 5 episodes of SVT lasting up to 4 beats and several overnight pauses up to 4.7 seconds. She had a sleep study that revealed minimal OSA and no treatment was recommended. Metoprolol was discontinued. She saw Dr. Shari Prows 09/2022 due to right ankle swelling. She was given a short course of furosemide. It was thought to be due to increased sodium intake.   At her visit 04/2023 she was doing well.  She noted some swelling at the end of the day.  She wasn't using her compression socks due to the weather.  BP was high in the office but controlled at home. She saw Phillips Hay, PharmD and amlodipine was switched to valsartan due to edema. Potassium increased so valsartan was reduced to 40mg . BP was controlled at follow up 07/2023.   At follow up 09/2023 she was feeling well and exercising regularly.  She had word finding difficulty that she attributed to the statin.  Rosuvastatin was held.  [name] is pleased with her recent blood pressure readings, noting an improvement to 148 mmHg during the visit from previous readings as high as 170 mmHg in medical settings. She does not currently monitor her blood pressure at home, having returned the home monitor in  December when her last recorded home reading was 136/70 mmHg. She acknowledges that her blood pressure tends to be higher in medical settings, describing it as 'white coat hypertension'.  Regarding her hyperlipidemia, she has been taking rosuvastatin, initially holding the medication and then cutting the dose as advised. She feels better with this adjustment. Her recent LDL cholesterol level was 782 mg/dL. She recalls having a zero calcium score in the past and expresses confusion about a previous note indicating coronary calcification, which she believes is incorrect. She is interested in maintaining her cholesterol levels and inquires about the frequency of future checks.  Her fasting blood sugar was 89 mg/dL, and her N5A was 2.1% in September 2023, indicating prediabetes. She is concerned about the potential impact of statins on her blood sugar levels but notes that her fasting glucose remains normal. She plans to follow up with her primary care provider for further monitoring.  She maintains an active lifestyle, walking regularly with her husband and staying on her feet throughout the day. She lives in a tri-level house and frequently uses the stairs. She describes her diet as healthy, with a focus on lean foods and minimal junk food, though she enjoys baking and having desserts after dinner. She does not add salt to her meals unless required by a recipe.  She takes vitamin D daily with her morning meal and inquires about the necessity of pairing it with vitamin K. 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      ROS:  Please see the history of present illness. All other systems are reviewed and negative.  (+) Bilateral LE swelling, R>L, with some associated pain  Studies Reviewed: .        Risk Assessment/Calculations:      Physical Exam:    VS:  BP 128/64   Pulse 81    Ht 5' 5.5" (1.664 m)   Wt 159 lb 14.4 oz (72.5 kg)   SpO2 98%   BMI 26.20 kg/m  , BMI Body mass index is 26.2 kg/m. GENERAL:  Well appearing HEENT: Pupils equal round and reactive, fundi not visualized, oral mucosa unremarkable NECK:  No jugular venous distention, waveform within normal limits, carotid upstroke brisk and symmetric, no bruits, no thyromegaly LUNGS:  Clear to auscultation bilaterally HEART:  RRR.  PMI not displaced or sustained,S1 and S2 within normal limits, no S3, no S4, no clicks, no rubs, no murmurs ABD:  Flat, positive bowel sounds normal in frequency in pitch, no bruits, no rebound, no guarding, no midline pulsatile mass, no hepatomegaly, no splenomegaly EXT:  2 plus pulses throughout, + LE edema with venous stasis changes R>L, no cyanosis no clubbing SKIN:  No rashes no nodules NEURO:  Cranial nerves II through XII grossly intact, motor grossly intact throughout PSYCH:  Cognitively intact, oriented to person place and time  Wt Readings from Last 3 Encounters:  12/13/23 159 lb 14.4 oz (72.5 kg)  09/11/23 157 lb 14.4 oz (71.6 kg)  06/20/23 159 lb (72.1 kg)     ASSESSMENT AND PLAN: .    # Hypertension Blood pressure in office 148, likely white coat hypertension. Home readings lower, valsartan effective at home. - Continue valsartan as prescribed. - Encourage home blood pressure monitoring monthly.  # Hyperlipidemia LDL 107, acceptable with zero calcium score. Rosuvastatin effective, aggressive lowering unnecessary without plaque. - Continue rosuvastatin as currently dosed. - Recheck lipid panel in one year.  # Prediabetes Fasting blood sugar 89, A1c 6.2 indicating prediabetes. Statins initiated post-diagnosis, benefits outweigh risks. - Monitor A1c during next primary care visit. - Consider repeating A1c in spring or summer.         Dispo:  FU with Ferris Fielden C. Duke Salvia, MD, [facility] in 1 year    Signed, Chilton Si, MD

## 2023-12-15 ENCOUNTER — Encounter (HOSPITAL_BASED_OUTPATIENT_CLINIC_OR_DEPARTMENT_OTHER): Payer: Self-pay | Admitting: Cardiovascular Disease

## 2024-01-15 ENCOUNTER — Other Ambulatory Visit: Payer: Self-pay | Admitting: Internal Medicine

## 2024-01-15 DIAGNOSIS — Z1231 Encounter for screening mammogram for malignant neoplasm of breast: Secondary | ICD-10-CM

## 2024-02-12 ENCOUNTER — Other Ambulatory Visit: Payer: Self-pay

## 2024-02-12 MED ORDER — ROSUVASTATIN CALCIUM 20 MG PO TABS: 20.0000 mg | ORAL_TABLET | Freq: Every day | ORAL | 3 refills | Status: AC

## 2024-03-10 ENCOUNTER — Ambulatory Visit
Admission: RE | Admit: 2024-03-10 | Discharge: 2024-03-10 | Disposition: A | Discharge status: Home / Self Care | Source: Ambulatory Visit | Attending: Internal Medicine | Admitting: Internal Medicine

## 2024-03-10 DIAGNOSIS — Z1231 Encounter for screening mammogram for malignant neoplasm of breast: Secondary | ICD-10-CM

## 2024-06-17 ENCOUNTER — Other Ambulatory Visit (HOSPITAL_BASED_OUTPATIENT_CLINIC_OR_DEPARTMENT_OTHER): Payer: Self-pay | Admitting: Cardiovascular Disease

## 2024-07-30 ENCOUNTER — Other Ambulatory Visit (HOSPITAL_BASED_OUTPATIENT_CLINIC_OR_DEPARTMENT_OTHER): Payer: Self-pay

## 2024-07-30 MED ORDER — FLUZONE HIGH-DOSE 0.5 ML IM SUSY
0.5000 mL | PREFILLED_SYRINGE | Freq: Once | INTRAMUSCULAR | 0 refills | Status: AC
  Filled 2024-07-30: qty 0.5, 1d supply, fill #0
